# Patient Record
Sex: Male | Born: 1995 | Race: Black or African American | Hispanic: No | Marital: Single | Smoking: Former smoker
Health system: Southern US, Community
[De-identification: ages and names within clinical notes are randomized; demographics above are authoritative.]

## PROBLEM LIST (undated history)

## (undated) DIAGNOSIS — I10 Essential (primary) hypertension: Secondary | ICD-10-CM

## (undated) DIAGNOSIS — R011 Cardiac murmur, unspecified: Secondary | ICD-10-CM

## (undated) DIAGNOSIS — J189 Pneumonia, unspecified organism: Secondary | ICD-10-CM

## (undated) DIAGNOSIS — T7840XA Allergy, unspecified, initial encounter: Secondary | ICD-10-CM

## (undated) DIAGNOSIS — B009 Herpesviral infection, unspecified: Secondary | ICD-10-CM

## (undated) HISTORY — DX: Pneumonia, unspecified organism: J18.9

## (undated) HISTORY — PX: HAND SURGERY: SHX662

## (undated) HISTORY — DX: Cardiac murmur, unspecified: R01.1

## (undated) HISTORY — DX: Allergy, unspecified, initial encounter: T78.40XA

## (undated) HISTORY — DX: Essential (primary) hypertension: I10

## (undated) HISTORY — PX: OTHER SURGICAL HISTORY: SHX169

---
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
## 2015-09-26 ENCOUNTER — Encounter: Payer: Self-pay | Admitting: Family Medicine

## 2015-09-26 ENCOUNTER — Ambulatory Visit (INDEPENDENT_AMBULATORY_CARE_PROVIDER_SITE_OTHER): Payer: BLUE CROSS/BLUE SHIELD | Admitting: Family Medicine

## 2015-09-26 VITALS — BP 132/74 | Temp 98.5°F | Ht 67.5 in | Wt 215.1 lb

## 2015-09-26 DIAGNOSIS — J329 Chronic sinusitis, unspecified: Secondary | ICD-10-CM | POA: Diagnosis not present

## 2015-09-26 DIAGNOSIS — J301 Allergic rhinitis due to pollen: Secondary | ICD-10-CM

## 2015-09-26 DIAGNOSIS — J029 Acute pharyngitis, unspecified: Secondary | ICD-10-CM

## 2015-09-26 LAB — POCT RAPID STREP A (OFFICE): RAPID STREP A SCREEN: NEGATIVE

## 2015-09-26 MED ORDER — AMOXICILLIN-POT CLAVULANATE 875-125 MG PO TABS: ORAL_TABLET | ORAL | Status: DC

## 2015-09-26 MED ORDER — CETIRIZINE HCL 10 MG PO TABS: ORAL_TABLET | ORAL | Status: DC

## 2015-09-26 MED ORDER — FLUTICASONE PROPIONATE 50 MCG/ACT NA SUSP: 2.0000 | Freq: Every day | NASAL | Status: DC

## 2015-09-26 NOTE — Progress Notes (Signed)
   Subjective:    Patient ID: Karl Smith, male    DOB: December 09, 1995, 20 y.o.   MRN: [id number]  Sore Throat  This is a new problem. The current episode started 1 to 4 weeks ago. The pain is worse on the left side. There has been no fever. Associated symptoms include congestion.   No headaches, sclearing throat a lot, dimished enrgy  No fever  Sore throat worse in the morning frontal headache positive nasal congestion  2 months duration of symptoms worsens allergies season is connecting, history of allergies as a young child  Patient states no other concerns this visit.  Results for orders placed or performed in visit on 09/26/15  POCT rapid strep A  Result Value Ref Range   Rapid Strep A Screen Negative Negative      Review of Systems  HENT: Positive for congestion.    no vomiting no diarrhea no fever     Objective:   Physical Exam  Alert, mild malaise. Hydration good Vitals stable. frontal/ maxillary tenderness evident positive nasal congestion. pharynx normal neck supple  lungs clear/no crackles or wheezes. heart regular in rhythm       Assessment & Plan:  Impression rhinosinusitis likely post viral, discussed with patient. plan antibiotics prescribed. Questions answered. Symptomatic care discussed. warning signs discussed. WSL With allergic rhinitis component discussed will add antihistamine and steroid nasal spray also

## 2015-10-09 ENCOUNTER — Encounter: Payer: Self-pay | Admitting: Family Medicine

## 2015-10-09 ENCOUNTER — Ambulatory Visit (INDEPENDENT_AMBULATORY_CARE_PROVIDER_SITE_OTHER): Payer: BLUE CROSS/BLUE SHIELD | Admitting: Family Medicine

## 2015-10-09 VITALS — BP 138/74 | Temp 98.8°F | Ht 67.5 in | Wt 223.0 lb

## 2015-10-09 DIAGNOSIS — J020 Streptococcal pharyngitis: Secondary | ICD-10-CM

## 2015-10-09 DIAGNOSIS — J029 Acute pharyngitis, unspecified: Secondary | ICD-10-CM

## 2015-10-09 LAB — POCT RAPID STREP A (OFFICE): RAPID STREP A SCREEN: POSITIVE — AB

## 2015-10-09 MED ORDER — CLARITHROMYCIN 500 MG PO TABS: 500.0000 mg | ORAL_TABLET | Freq: Two times a day (BID) | ORAL | Status: DC

## 2015-10-09 NOTE — Progress Notes (Signed)
   Subjective:    Patient ID: Karl Smith, male    DOB: 1995-10-03, 20 y.o.   MRN: [id number]  Sore Throat  This is a new problem. Episode onset: 2 -3 weeks. Associated symptoms include coughing. Associated symptoms comments: Chest tightness, [place] spots on tonsils . Treatments tried: augmentin.   Diffuse headache at times. Diminished energy. Diminished appetite.   Venturino swollen patches in the throat.  Results for orders placed or performed in visit on 10/09/15  POCT rapid strep A  Result Value Ref Range   Rapid Strep A Screen Positive (A) Negative     Review of Systems  Respiratory: Positive for cough.    no vomiting or diarrhea     Objective:   Physical Exam Alert vitals stable HEENT impressive bilateral exudative tonsillitis tender anterior nodes neck supple lungs clear heart regular in rhythm       Assessment & Plan:  Impression tonsillitis with cervical lymphadenitis and positive strep screen plan antibiotics prescribed. Symptom care discussed warning signs

## 2015-11-13 ENCOUNTER — Ambulatory Visit (INDEPENDENT_AMBULATORY_CARE_PROVIDER_SITE_OTHER): Payer: BLUE CROSS/BLUE SHIELD | Admitting: Nurse Practitioner

## 2015-11-13 ENCOUNTER — Encounter: Payer: Self-pay | Admitting: Nurse Practitioner

## 2015-11-13 VITALS — BP 134/78 | Temp 98.0°F | Ht 67.5 in | Wt 227.5 lb

## 2015-11-13 DIAGNOSIS — J039 Acute tonsillitis, unspecified: Secondary | ICD-10-CM | POA: Diagnosis not present

## 2015-11-13 DIAGNOSIS — J300 Vasomotor rhinitis: Secondary | ICD-10-CM | POA: Diagnosis not present

## 2015-11-13 DIAGNOSIS — J029 Acute pharyngitis, unspecified: Secondary | ICD-10-CM | POA: Diagnosis not present

## 2015-11-13 LAB — POCT RAPID STREP A (OFFICE): RAPID STREP A SCREEN: NEGATIVE

## 2015-11-13 NOTE — Patient Instructions (Signed)
OTC antihistamine Steroid nasal spray (Flonase or Nasacort AQ)

## 2015-11-14 LAB — PLEASE NOTE

## 2015-11-14 LAB — STREP A DNA PROBE: STREP GP A DIRECT, DNA PROBE: NEGATIVE

## 2015-11-15 ENCOUNTER — Encounter: Payer: Self-pay | Admitting: Family Medicine

## 2015-11-15 ENCOUNTER — Encounter: Payer: Self-pay | Admitting: Nurse Practitioner

## 2015-11-15 NOTE — Progress Notes (Signed)
Subjective:  Presents for c/o swollen tonsils with [place] spots that began about a week ago. This is patient's third visit for sore throat since 4/25. No fever. Slight runny nose and cough. No wheezing. No ear pain. Taking fluids well. Voiding nl. Relates recurrent problems to exposure to dust at his job.   Objective:   BP 134/78 mmHg  Temp(Src) 98 F (36.7 C) (Oral)  Ht 5' 7.5" (1.715 m)  Wt 227 lb 8 oz (103.193 kg)  BMI 35.09 kg/m2 NAD. Alert, oriented. TMs clear effusion, no erythema. Pharynx non erythematous with a few areas of Clason exudate. RST neg. Neck supple with minimal anterior cervical adenopathy. Lungs clear. Heart RRR.   Assessment: Acute pharyngitis, unspecified etiology - Plan: POCT rapid strep A, Strep A DNA probe, Ambulatory referral to ENT  Vasomotor rhinitis - Plan: Ambulatory referral to ENT  Acute tonsillitis, unspecified etiology - Plan: Ambulatory referral to ENT   Plan: throat culture pending.  OTC antihistamine Steroid nasal spray (Flonase or Nasacort AQ) Refer to ENT specialist per patient request. Call back sooner if symptoms worsen.

## 2015-12-13 ENCOUNTER — Encounter: Payer: Self-pay | Admitting: Family Medicine

## 2015-12-13 ENCOUNTER — Ambulatory Visit (INDEPENDENT_AMBULATORY_CARE_PROVIDER_SITE_OTHER): Payer: BLUE CROSS/BLUE SHIELD | Admitting: Family Medicine

## 2015-12-13 VITALS — BP 126/80 | Ht 67.5 in | Wt 233.1 lb

## 2015-12-13 DIAGNOSIS — J301 Allergic rhinitis due to pollen: Secondary | ICD-10-CM
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
## 2016-01-10 ENCOUNTER — Encounter: Payer: Self-pay | Admitting: Family Medicine

## 2016-05-12 ENCOUNTER — Emergency Department (HOSPITAL_COMMUNITY): Payer: BLUE CROSS/BLUE SHIELD

## 2016-05-12 ENCOUNTER — Emergency Department (HOSPITAL_COMMUNITY)
Admission: EM | Admit: 2016-05-12 | Discharge: 2016-05-12 | Disposition: A | Discharge status: Home / Self Care | Payer: BLUE CROSS/BLUE SHIELD | Attending: Emergency Medicine | Admitting: Emergency Medicine

## 2016-05-12 ENCOUNTER — Encounter (HOSPITAL_COMMUNITY): Payer: Self-pay | Admitting: Emergency Medicine

## 2016-05-12 DIAGNOSIS — Z87891 Personal history of nicotine dependence: Secondary | ICD-10-CM | POA: Diagnosis not present

## 2016-05-12 DIAGNOSIS — M79641 Pain in right hand: Secondary | ICD-10-CM | POA: Diagnosis not present

## 2016-05-12 DIAGNOSIS — Z23 Encounter for immunization: Secondary | ICD-10-CM | POA: Diagnosis not present

## 2016-05-12 DIAGNOSIS — S60511A Abrasion of right hand, initial encounter: Secondary | ICD-10-CM

## 2016-05-12 DIAGNOSIS — S60311A Abrasion of right thumb, initial encounter: Secondary | ICD-10-CM | POA: Diagnosis not present

## 2016-05-12 DIAGNOSIS — Y999 Unspecified external cause status: Secondary | ICD-10-CM | POA: Insufficient documentation

## 2016-05-12 DIAGNOSIS — Y939 Activity, unspecified: Secondary | ICD-10-CM | POA: Insufficient documentation

## 2016-05-12 DIAGNOSIS — Y929 Unspecified place or not applicable: Secondary | ICD-10-CM | POA: Insufficient documentation

## 2016-05-12 DIAGNOSIS — W2201XA Walked into wall, initial encounter: Secondary | ICD-10-CM | POA: Diagnosis not present

## 2016-05-12 DIAGNOSIS — S6991XA Unspecified injury of right wrist, hand and finger(s), initial encounter: Secondary | ICD-10-CM | POA: Diagnosis present

## 2016-05-12 MED ORDER — BACITRACIN ZINC 500 UNIT/GM EX OINT
TOPICAL_OINTMENT | CUTANEOUS | Status: AC
  Administered 2016-05-12: 1
  Filled 2016-05-12: qty 0.9

## 2016-05-12 MED ORDER — IBUPROFEN 800 MG PO TABS
800.0000 mg | ORAL_TABLET | Freq: Once | ORAL | Status: AC
  Administered 2016-05-12: 800 mg via ORAL
  Filled 2016-05-12: qty 1

## 2016-05-12 MED ORDER — TETANUS-DIPHTH-ACELL PERTUSSIS 5-2.5-18.5 LF-MCG/0.5 IM SUSP
0.5000 mL | Freq: Once | INTRAMUSCULAR | Status: AC
  Administered 2016-05-12: 0.5 mL via INTRAMUSCULAR
  Filled 2016-05-12: qty 0.5

## 2016-05-12 MED ORDER — IBUPROFEN 800 MG PO TABS: 800.0000 mg | ORAL_TABLET | Freq: Three times a day (TID) | ORAL | 0 refills | Status: DC

## 2016-05-12 NOTE — ED Provider Notes (Signed)
[facility]-EMERGENCY DEPT Provider Note   CSN: [id number] Arrival date & time: 05/12/16  1758  By signing my name below, I, Teofilo PodMatthew P. Jamison, attest that this documentation has been prepared under the direction and in the presence of Langston MaskerKaren Sofia, [place]PA-C. Electronically Signed: Teofilo PodMatthew P. Jamison, ED Scribe. 05/12/2016. 7:39 PM.    History   Chief Complaint Chief Complaint  Patient presents with  . Hand Injury    The history is provided by the patient. No language interpreter was used.   HPI Comments:  Karl Smith is a 20 y.o. male who presents to the Emergency Department s/p an injury to his right hand that he sustained PTA. Pt reports that he punched a wall made of wood. Pt notes a wound and swelling to his right thumb. Tetanus is not UTD. Pt has been able to control bleeding with a rag. Pt denies other associated symptoms.   Past Medical History:  Diagnosis Date  . Allergy    seasonal  . Heart murmur   . High blood pressure   . Pneumonia 3918 months old    Patient Active Problem List   Diagnosis Date Noted  . Elevated blood pressure 11/23/2012  . CLOSED FRACTURE OF BASE OF THUMB METACARPAL BONE 02/01/2008    Past Surgical History:  Procedure Laterality Date  . HAND SURGERY    . thumb surgery    . thumb surgery Right 7th grade       Home Medications    Prior to Admission medications   Medication Sig Start Date End Date Taking? Authorizing Provider  cetirizine (ZYRTEC) 10 MG tablet Take 1 tablet by mouth at bedtime Patient not taking: Reported on 11/13/2015 09/26/15   Merlyn AlbertWilliam S Luking, MD  clarithromycin (BIAXIN) 500 MG tablet Take 1 tablet (500 mg total) by mouth 2 (two) times daily. Patient not taking: Reported on 11/13/2015 10/09/15   Merlyn AlbertWilliam S Luking, MD  fexofenadine (ALLEGRA) 180 MG tablet Take 180 mg by mouth daily.    Historical Provider, MD  fluticasone (FLONASE) 50 MCG/ACT nasal spray Place 2 sprays into both nostrils daily. Patient not taking: Reported on  11/13/2015 09/26/15   Merlyn AlbertWilliam S Luking, MD    Family History Family History  Problem Relation Age of Onset  . Depression Father   . Bipolar disorder Cousin     Social History Social History  Substance Use Topics  . Smoking status: Former Games developermoker  . Smokeless tobacco: Never Used  . Alcohol use No     Allergies   Patient has no known allergies.   Review of Systems Review of Systems  Musculoskeletal: Positive for arthralgias and joint swelling.  Skin: Positive for wound.  All other systems reviewed and are negative.    Physical Exam Updated Vital Signs BP 139/84 (BP Location: Left Arm)   Pulse 85   Temp 98.4 F (36.9 C) (Oral)   Resp 18   Ht 5\' 7"  (1.702 m)   Wt 230 lb (104.3 kg)   SpO2 100%   BMI 36.02 kg/m   Physical Exam  Constitutional: He appears well-developed and well-nourished. No distress.  HENT:  Head: Normocephalic and atraumatic.  Eyes: Conjunctivae are normal.  Cardiovascular: Normal rate.   Pulmonary/Chest: Effort normal.  Abdominal: He exhibits no distension.  Neurological: He is alert.  Skin: Skin is warm and dry.  Superficial abrasions to right thumb. Moderate swelling. Neurosensory and neurovascularly intact.  Psychiatric: He has a normal mood and affect.  Nursing note and vitals reviewed.  ED Treatments / Results  DIAGNOSTIC STUDIES:  Oxygen Saturation is 100% on RA, normal by my interpretation.    COORDINATION OF CARE:  7:39 PM Discussed treatment plan with pt at bedside and pt agreed to plan.   Labs (all labs ordered are listed, but only abnormal results are displayed) Labs Reviewed - No data to display  EKG  EKG Interpretation None       Radiology Dg Hand Complete Right  Result Date: 05/12/2016 CLINICAL DATA:  Punched wall with hand pain, initial encounter EXAM: RIGHT HAND - COMPLETE 3+ VIEW COMPARISON:  None. FINDINGS: There is no evidence of fracture or dislocation. There is no evidence of arthropathy or other  focal bone abnormality. Soft tissues are unremarkable. IMPRESSION: No acute abnormality noted. Electronically Signed   By: Alcide CleverMark  Lukens M.D.   On: 05/12/2016 19:14    Procedures Procedures (including critical care time)  Medications Ordered in ED Medications - No data to display   Initial Impression / Assessment and Plan / ED Course  I have reviewed the triage vital signs and the nursing notes.  Pertinent labs & imaging results that were available during my care of the patient were reviewed by me and considered in my medical decision making (see chart for details).  Clinical Course     No fracture superficail abrasions,  Pt given tetanus and ibuprofen  Final Clinical Impressions(s) / ED Diagnoses   Final diagnoses:  Abrasion of right hand, initial encounter    New Prescriptions Discharge Medication List as of 05/12/2016  7:49 PM    START taking these medications   Details  ibuprofen (ADVIL,MOTRIN) 800 MG tablet Take 1 tablet (800 mg total) by mouth 3 (three) times daily., Starting Sun 05/12/2016, Print      An After Visit Summary was printed and given to the patient.   Karl SkinnerLeslie K CreeksideSofia, PA-C 05/12/16 2116    Samuel JesterKathleen McManus, DO 05/16/16 2101

## 2016-05-12 NOTE — ED Triage Notes (Signed)
PT states he punched a wooded wall this evening and now c/o pain with bleeding controlled around right thumb nail bed with swelling.

## 2016-05-12 NOTE — ED Notes (Signed)
Thumb washed with soap and water, anbx ointment applied, nonstick telfa, gauze, tape applied.

## 2016-06-11 ENCOUNTER — Ambulatory Visit (INDEPENDENT_AMBULATORY_CARE_PROVIDER_SITE_OTHER): Payer: BLUE CROSS/BLUE SHIELD | Admitting: Family Medicine

## 2016-06-11 ENCOUNTER — Encounter: Payer: Self-pay | Admitting: Family Medicine

## 2016-06-11 VITALS — Temp 98.2°F | Ht 67.5 in | Wt 245.2 lb

## 2016-06-11 DIAGNOSIS — J329 Chronic sinusitis, unspecified: Secondary | ICD-10-CM | POA: Diagnosis not present

## 2016-06-11 DIAGNOSIS — J4520 Mild intermittent asthma, uncomplicated: Secondary | ICD-10-CM | POA: Diagnosis not present

## 2016-06-11 DIAGNOSIS — J310 Chronic rhinitis: Secondary | ICD-10-CM

## 2016-06-11 MED ORDER — NAPROXEN 500 MG PO TABS: 500.0000 mg | ORAL_TABLET | Freq: Two times a day (BID) | ORAL | 0 refills | Status: DC | PRN

## 2016-06-11 MED ORDER — ALBUTEROL SULFATE HFA 108 (90 BASE) MCG/ACT IN AERS: 2.0000 | INHALATION_SPRAY | Freq: Four times a day (QID) | RESPIRATORY_TRACT | 0 refills | Status: DC | PRN

## 2016-06-11 MED ORDER — AZITHROMYCIN 250 MG PO TABS: ORAL_TABLET | ORAL | 0 refills | Status: DC

## 2016-06-11 NOTE — Progress Notes (Signed)
   Subjective:    Patient ID: Karl Smith, male    DOB: [birth year], 21 y.o.   MRN: [id number]  Cough  This is a new problem. The current episode started more than 1 month ago (off and on-restarted this week). Associated symptoms include ear pain, headaches, nasal congestion, a sore throat and wheezing. Treatments tried: nyquil.    Feel sob with the cough and wheezing  Frontal headache mucinex severe   Coughing up dark yellow stufpt smokes soe   Wheezing can occur with exrtion   Review of Systems  HENT: Positive for ear pain and sore throat.   Respiratory: Positive for cough and wheezing.   Neurological: Positive for headaches.       Objective:   Physical Exam Alert mild malaise occasional bronchial cough during exam. H&T mom his congestion pharynx normal lungs rare wheeze heart rare rhythm       Assessment & Plan:  Impression post viral rhinosinusitis/bronchitis with element of reactive airways plan Z-Pak. Albuterol 2 sprays 4 times a day symptom care discussed warning signs discussed WSL

## 2016-09-12 DIAGNOSIS — S93402A Sprain of unspecified ligament of left ankle, initial encounter: Secondary | ICD-10-CM | POA: Diagnosis not present

## 2016-09-16 DIAGNOSIS — F329 Major depressive disorder, single episode, unspecified: Secondary | ICD-10-CM | POA: Diagnosis not present

## 2016-09-30 DIAGNOSIS — F329 Major depressive disorder, single episode, unspecified: Secondary | ICD-10-CM | POA: Diagnosis not present

## 2016-10-07 DIAGNOSIS — F329 Major depressive disorder, single episode, unspecified: Secondary | ICD-10-CM | POA: Diagnosis not present

## 2016-11-04 DIAGNOSIS — F329 Major depressive disorder, single episode, unspecified: Secondary | ICD-10-CM | POA: Diagnosis not present

## 2017-01-28 ENCOUNTER — Encounter: Payer: Self-pay | Admitting: Family Medicine

## 2017-01-28 ENCOUNTER — Ambulatory Visit (INDEPENDENT_AMBULATORY_CARE_PROVIDER_SITE_OTHER): Payer: BLUE CROSS/BLUE SHIELD | Admitting: Family Medicine

## 2017-01-28 VITALS — BP 118/76 | Ht 67.5 in | Wt 219.2 lb

## 2017-01-28 DIAGNOSIS — J310 Chronic rhinitis: Secondary | ICD-10-CM

## 2017-01-28 DIAGNOSIS — J329 Chronic sinusitis, unspecified: Secondary | ICD-10-CM

## 2017-01-28 MED ORDER — AMOXICILLIN 500 MG PO CAPS: 500.0000 mg | ORAL_CAPSULE | Freq: Three times a day (TID) | ORAL | 0 refills | Status: DC
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
## 2017-05-29 ENCOUNTER — Encounter: Payer: Self-pay | Admitting: Family Medicine

## 2017-10-25 IMAGING — DX DG HAND COMPLETE 3+V*R*
3 series · 3 of 3 positions shown · non-contrast
Comparison: None.

CLINICAL DATA: Punched wall with hand pain, initial encounter

EXAM:
RIGHT HAND - COMPLETE 3+ VIEW

[hand pa]
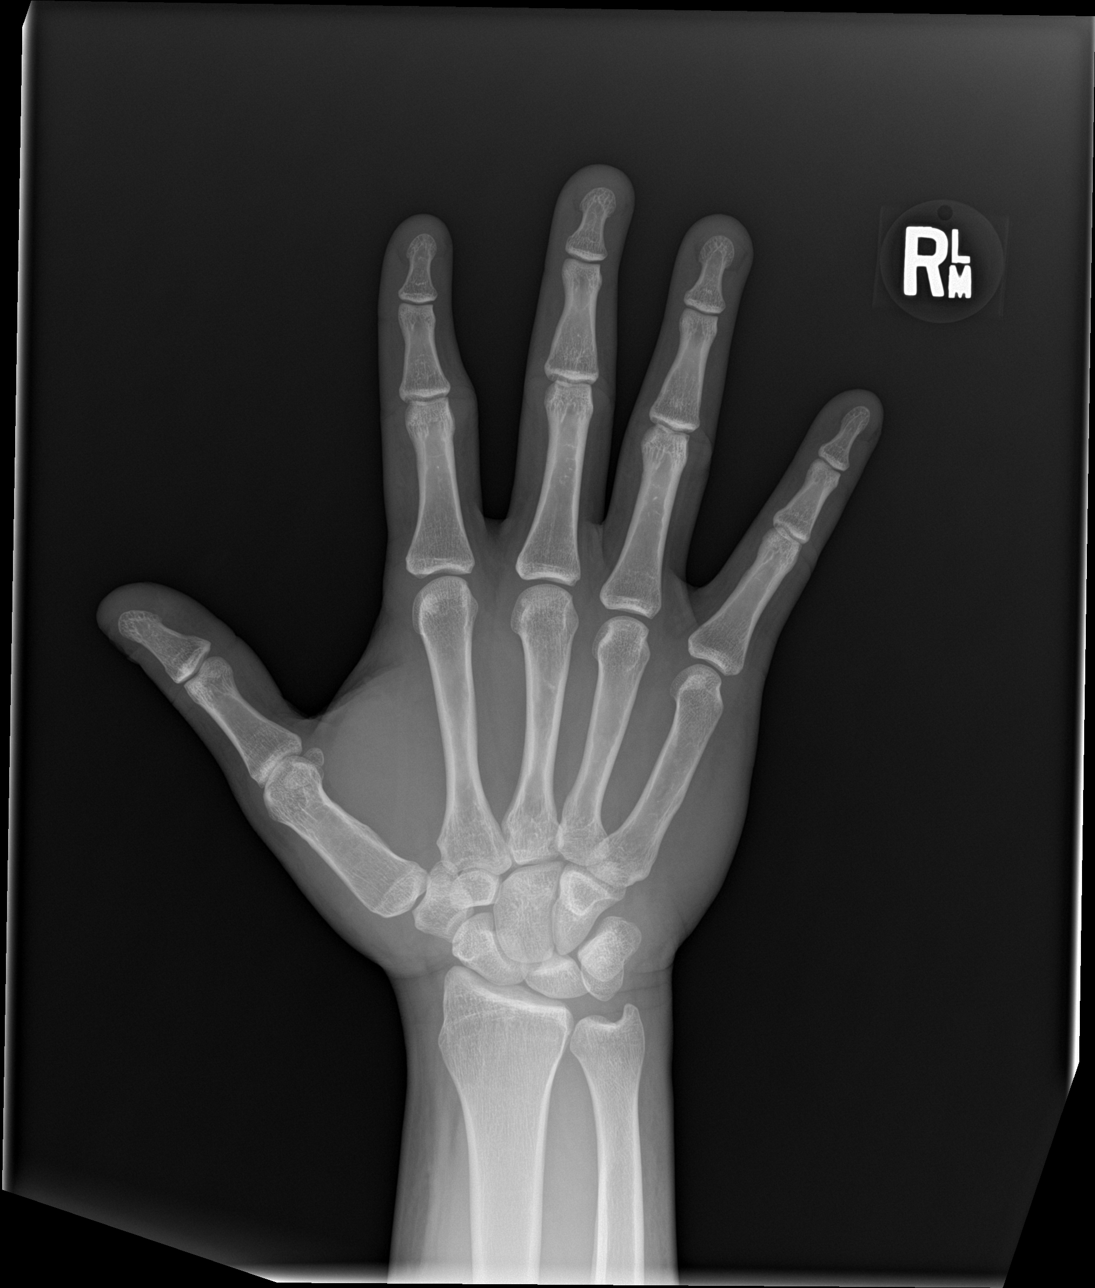

[hand obl]
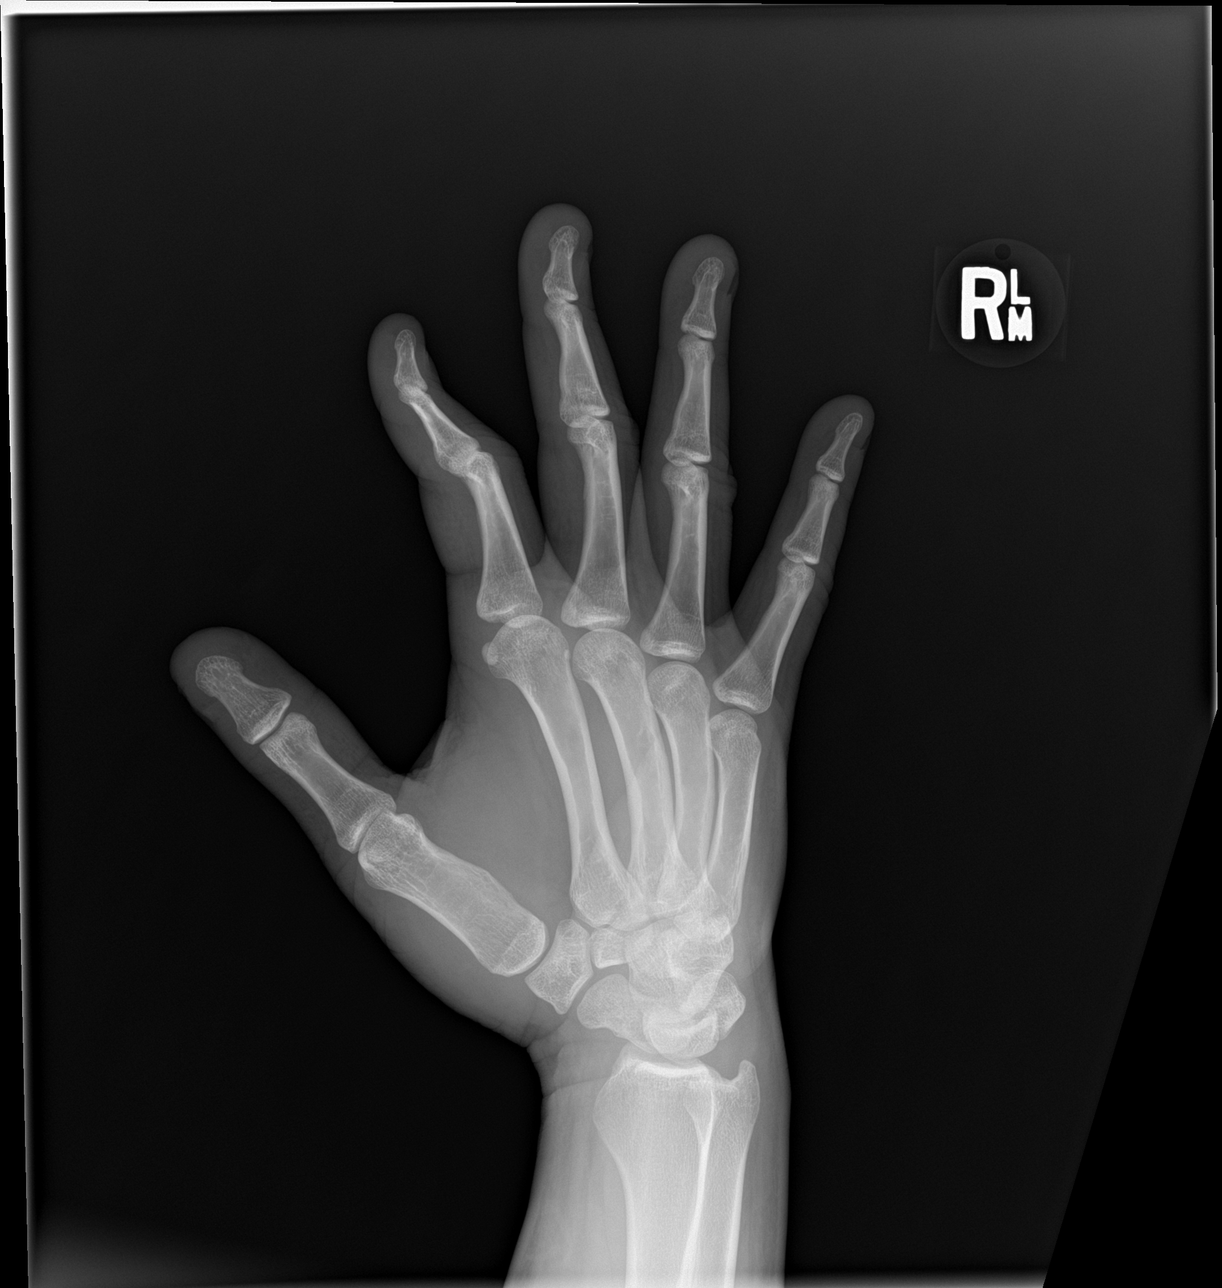

[hand lat]
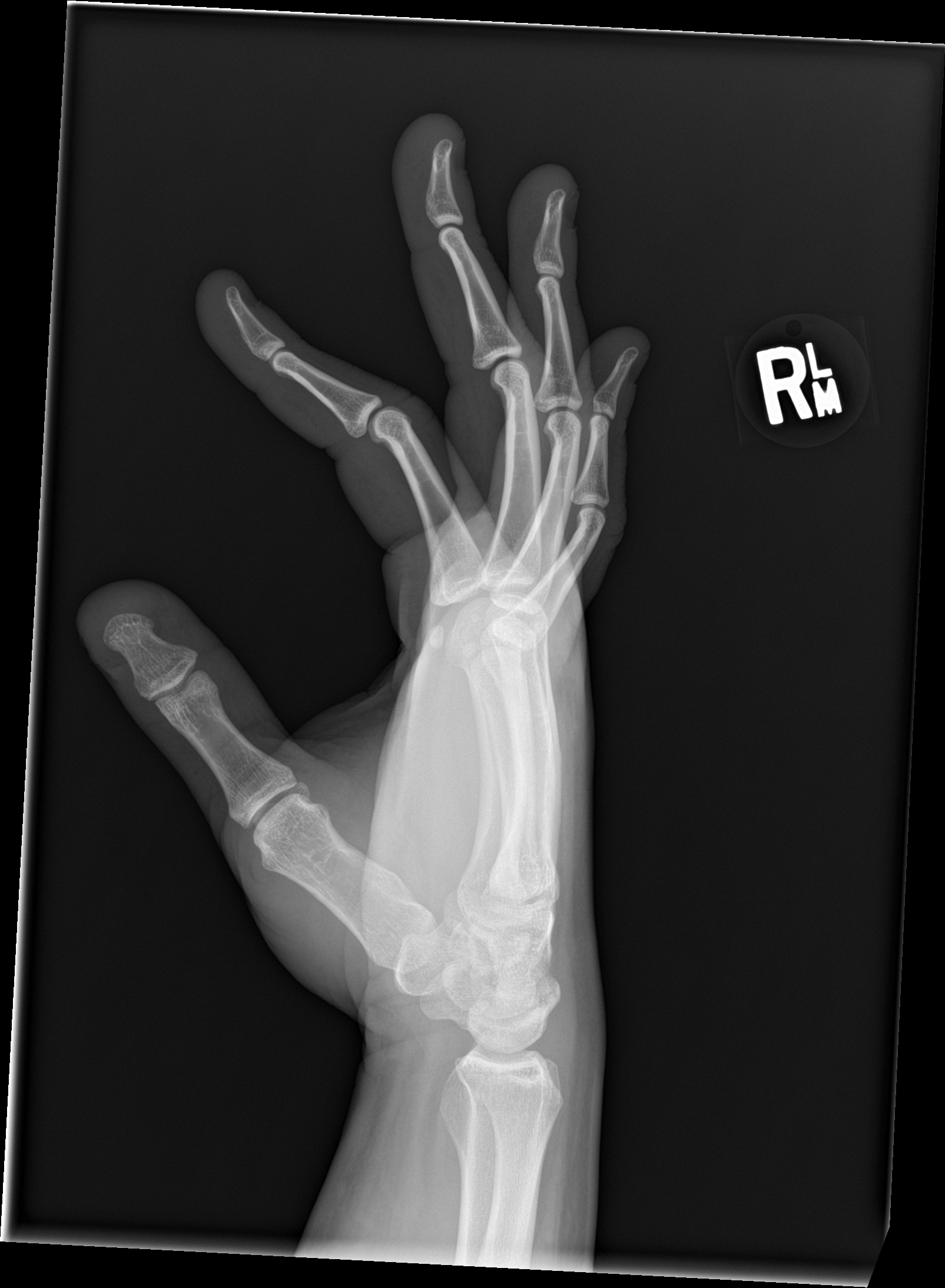

[3 of 3 positions shown; findings below may reference images not displayed]

FINDINGS: There is no evidence of fracture or dislocation. There is no
evidence of arthropathy or other focal bone abnormality. Soft
tissues are unremarkable.
IMPRESSION: No acute abnormality noted.

## 2018-02-16 ENCOUNTER — Ambulatory Visit: Payer: BLUE CROSS/BLUE SHIELD | Admitting: Family Medicine

## 2018-02-16 ENCOUNTER — Encounter: Payer: Self-pay | Admitting: Family Medicine

## 2018-02-16 VITALS — BP 118/80 | Temp 98.8°F | Ht 67.5 in | Wt 211.4 lb

## 2018-02-16 DIAGNOSIS — R300 Dysuria: Secondary | ICD-10-CM | POA: Diagnosis not present

## 2018-02-16 LAB — POCT URINALYSIS DIPSTICK
PH UA: 5 (ref 5.0–8.0)
PROTEIN UA: POSITIVE — AB
Spec Grav, UA: >=1.03 — AB (ref 1.010–1.025)

## 2018-02-16 MED ORDER — DOXYCYCLINE HYCLATE 100 MG PO TABS: 100.0000 mg | ORAL_TABLET | Freq: Two times a day (BID) | ORAL | 0 refills | Status: DC

## 2018-02-16 NOTE — Progress Notes (Signed)
   Subjective:    Patient ID: Karl Smith, male    DOB: 1996-01-18, 22 y.o.   MRN: [id number]  Urinary Tract Infection   This is a new problem. The current episode started in the past 7 days. The quality of the pain is described as aching. Associated symptoms comments: Pressure near bladder. He has tried nothing for the symptoms.   Pt states his girlfriend was tested today and was told she may have chlyamidia.    Pt is not having any signs/symptoms of STDS.  Results for orders placed or performed in visit on 02/16/18  POCT Urinalysis Dipstick  Result Value Ref Range   Color, UA     Clarity, UA     Glucose, UA     Bilirubin, UA +    Ketones, UA     Spec Grav, UA >=1.030 (A) 1.010 - 1.025   Blood, UA     pH, UA 5.0 5.0 - 8.0   Protein, UA Positive (A) Negative   Urobilinogen, UA     Nitrite, UA     Leukocytes, UA     Appearance     Odor     Positive potential expoure to chlamydia  No dysuria  No discharge   No night   Review of Systems No headache, no major weight loss or weight gain, no chest pain no back pain abdominal pain no change in bowel habits complete ROS otherwise negative     Objective:   Physical Exam  Alert vitals stable, NAD. Blood pressure good on repeat. HEENT normal. Lungs clear. Heart regular rate and rhythm.       Assessment & Plan:  Impression positive exposure to chlamydia through his girlfriend.  Discussed.  Urinalysis unremarkable.  With definite positive exposure confirmed, recommend treatment.  Management discussed

## 2018-02-28 ENCOUNTER — Encounter (HOSPITAL_COMMUNITY): Payer: Self-pay | Admitting: Emergency Medicine

## 2018-02-28 ENCOUNTER — Other Ambulatory Visit: Payer: Self-pay

## 2018-02-28 ENCOUNTER — Emergency Department (HOSPITAL_COMMUNITY)
Admission: EM | Admit: 2018-02-28 | Discharge: 2018-02-28 | Disposition: A | Discharge status: Home / Self Care | Payer: BLUE CROSS/BLUE SHIELD | Attending: Emergency Medicine | Admitting: Emergency Medicine

## 2018-02-28 DIAGNOSIS — Z79899 Other long term (current) drug therapy: Secondary | ICD-10-CM | POA: Diagnosis not present

## 2018-02-28 DIAGNOSIS — R300 Dysuria: Secondary | ICD-10-CM | POA: Diagnosis not present

## 2018-02-28 DIAGNOSIS — I10 Essential (primary) hypertension: Secondary | ICD-10-CM | POA: Diagnosis not present

## 2018-02-28 DIAGNOSIS — Z87891 Personal history of nicotine dependence: Secondary | ICD-10-CM | POA: Diagnosis not present

## 2018-02-28 LAB — URINALYSIS, ROUTINE W REFLEX MICROSCOPIC
BILIRUBIN URINE: NEGATIVE
Glucose, UA: NEGATIVE mg/dL
Hgb urine dipstick: NEGATIVE
KETONES UR: NEGATIVE mg/dL
Leukocytes, UA: NEGATIVE
NITRITE: NEGATIVE
Protein, ur: NEGATIVE mg/dL
Specific Gravity, Urine: 1.027 (ref 1.005–1.030)
pH: 5 (ref 5.0–8.0)

## 2018-02-28 MED ORDER — AZITHROMYCIN 250 MG PO TABS
1000.0000 mg | ORAL_TABLET | Freq: Once | ORAL | Status: AC
  Administered 2018-02-28: 1000 mg via ORAL
  Filled 2018-02-28: qty 4

## 2018-02-28 MED ORDER — CEFTRIAXONE SODIUM 250 MG IJ SOLR
250.0000 mg | Freq: Once | INTRAMUSCULAR | Status: AC
  Administered 2018-02-28: 250 mg via INTRAMUSCULAR
  Filled 2018-02-28: qty 250

## 2018-02-28 MED ORDER — STERILE WATER FOR INJECTION IJ SOLN
INTRAMUSCULAR | Status: AC
  Administered 2018-02-28: 10 mL
  Filled 2018-02-28: qty 10

## 2018-02-28 NOTE — ED Triage Notes (Signed)
Patient seen by PCP a week ago and diagnosed with UTI after having lower abd pressure and urinary frequency. Patient states symptoms did improve but now has dysuria and some soreness in right flank pain. Patient also reports pain with intercourse. Denies any discharge. Denies any fevers.

## 2018-02-28 NOTE — ED Provider Notes (Signed)
[facility] EMERGENCY DEPARTMENT Provider Note   CSN: [id number] Arrival date & time: 02/28/18  1308     History   Chief Complaint Chief Complaint  Patient presents with  . Dysuria    HPI Karl Smith is a 22 y.o. male is here for evaluation of dysuria.  Onset 2 days ago.  Associated with burning discomfort to the tip of the penis with ejaculation.  Has had similar symptoms twice in the past, one time while he was incarcerated and was not sexually active.  This episode resolved on its own after he drank a lot of water.  Second episode occurred 2 months ago and he went to his PCP who told him he had a UTI and gave him doxycycline which she took completely.  At that time, his girlfriend was also having some UTI type symptoms.  He denies any previous diagnosed STDs in the past.  He denies fevers, testicular pain, blood in the urine, abdominal pain, flank pain, genital lesions, inflammation of penis.  He is sexually active with male partner without condom use.  No interventions for this.  No alleviating factors.  HPI  Past Medical History:  Diagnosis Date  . Allergy    seasonal  . Heart murmur   . High blood pressure   . Pneumonia 53 months old    Patient Active Problem List   Diagnosis Date Noted  . Elevated blood pressure 11/23/2012  . CLOSED FRACTURE OF BASE OF THUMB METACARPAL BONE 02/01/2008    Past Surgical History:  Procedure Laterality Date  . HAND SURGERY    . thumb surgery    . thumb surgery Right 7th grade        Home Medications    Prior to Admission medications   Medication Sig Start Date End Date Taking? Authorizing Provider  albuterol (PROVENTIL HFA;VENTOLIN HFA) 108 (90 Base) MCG/ACT inhaler Inhale 2 puffs into the lungs every 6 (six) hours as needed for wheezing or shortness of breath. 06/11/16   Merlyn Albert, MD  amoxicillin (AMOXIL) 500 MG capsule Take 1 capsule (500 mg total) by mouth 3 (three) times daily. 01/28/17   Merlyn Albert, MD    azithromycin (ZITHROMAX Z-PAK) 250 MG tablet Take 2 tablets (500 mg) on  Day 1,  followed by 1 tablet (250 mg) once daily on Days 2 through 5. 06/11/16   Merlyn Albert, MD  doxycycline (VIBRA-TABS) 100 MG tablet Take 1 tablet (100 mg total) by mouth 2 (two) times daily. 02/16/18   Merlyn Albert, MD  fexofenadine (ALLEGRA) 180 MG tablet Take 180 mg by mouth daily.    [provider]  ibuprofen (ADVIL,MOTRIN) 800 MG tablet Take 1 tablet (800 mg total) by mouth 3 (three) times daily. 05/12/16   Elson Areas, PA-C  naproxen (NAPROSYN) 500 MG tablet Take 1 tablet (500 mg total) by mouth 2 (two) times daily as needed. 06/11/16   Merlyn Albert, MD    Family History Family History  Problem Relation Age of Onset  . Depression Father   . Bipolar disorder Cousin     Social History Social History   Tobacco Use  . Smoking status: Former Smoker    Types: Cigarettes  . Smokeless tobacco: Never Used  Substance Use Topics  . Alcohol use: Yes    Alcohol/week: 1.0 standard drinks    Types: 1 Cans of beer per week  . Drug use: No     Allergies   Patient has no  known allergies.   Review of Systems Review of Systems  Genitourinary: Positive for dysuria.  All other systems reviewed and are negative.    Physical Exam Updated Vital Signs BP 127/86 (BP Location: Right Arm)   Pulse 62   Temp 99.1 F (37.3 C) (Oral)   Resp 14   Ht 5' 7.5" (1.715 m)   Wt 95.3 kg   SpO2 100%   BMI 32.41 kg/m   Physical Exam  Constitutional: He is oriented to person, place, and time. He appears well-developed and well-nourished. No distress.  NAD.  HENT:  Head: Normocephalic and atraumatic.  Right Ear: External ear normal.  Left Ear: External ear normal.  Nose: Nose normal.  Eyes: Conjunctivae and EOM are normal. No scleral icterus.  Neck: Normal range of motion. Neck supple.  Cardiovascular: Normal rate, regular rhythm, normal heart sounds and intact distal pulses.  No murmur  heard. Pulmonary/Chest: Effort normal and breath sounds normal. He has no wheezes.  Abdominal: Soft. There is no tenderness.  No suprapubic or CVA tenderness.  Negative Murphy's and McBurney's.  No G/R/R.  Genitourinary:  Genitourinary Comments: External genitalia normal without erythema, edema, tenderness or lesions.  Circumcised male.  No groin lymphadenopathy.  Scant clear meatus discharge Glans and shaft smooth without tenderness, lesions, masses or deformity.  Scrotum without lesions or edema.  Non tender testicles. Epididymis and spermatic cord without tenderness or masses, bilaterally.  Musculoskeletal: Normal range of motion. He exhibits no deformity.  Neurological: He is alert and oriented to person, place, and time.  Skin: Skin is warm and dry. Capillary refill takes less than 2 seconds.  Psychiatric: He has a normal mood and affect. His behavior is normal. Judgment and thought content normal.  Nursing note and vitals reviewed.    ED Treatments / Results  Labs (all labs ordered are listed, but only abnormal results are displayed) Labs Reviewed  URINALYSIS, ROUTINE W REFLEX MICROSCOPIC  GC/CHLAMYDIA PROBE AMP ([place]) NOT AT [facility]    EKG None  Radiology No results found.  Procedures Procedures (including critical care time)  Medications Ordered in ED Medications  cefTRIAXone (ROCEPHIN) injection 250 mg (250 mg Intramuscular Given 02/28/18 1413)  azithromycin (ZITHROMAX) tablet 1,000 mg (1,000 mg Oral Given 02/28/18 1413)  sterile water (preservative free) injection (10 mLs  Given 02/28/18 1413)     Initial Impression / Assessment and Plan / ED Course  I have reviewed the triage vital signs and the nursing notes.  Pertinent labs & imaging results that were available during my care of the patient were reviewed by me and considered in my medical decision making (see chart for details).     -Differential is a STD given age, sexual practices.  I reviewed  patient's previous PCP visit for dysuria 2 weeks ago, urinalysis does not appear to be infected at that time.  UA today is negative.  It does not look significantly concentrated, normal pH.  There was scant amount of clear meatus discharge which suggest likely STD as well.  GC/chlamydia swabs pending.  He was empirically treated in the ER with ceftriaxone and azithromycin.  Provided safe sex practices education.  Patient was discharged with instructions to follow-up with PCP in 1 week if symptoms are persistent.  He has no fever, chills, suprapubic or CVA tenderness, penile lesions to suggest systemic infection, pyelonephritis, candidal irritation.  Final Clinical Impressions(s) / ED Diagnoses   Final diagnoses:  Dysuria    ED Discharge Orders    None  Liberty Handy, PA-C 02/28/18 1500    Loren Racer, MD 03/01/18 1233

## 2018-02-28 NOTE — Discharge Instructions (Signed)
You were seen in the ER for burning with urination.  You do not have a urinary tract infection.  We tested you for gonorrhea and chlamydia today, the results come back in 2 to 3 days.  You will be called with positive results only.  You were treated today for gonorrhea and chlamydia given your symptoms and sexual practices.  Do not have sexual encounters for the next 7 to 10 days after treatment.  Follow-up with your primary care doctor in the next week if the symptoms have not improved.  Return to the ER for fevers, chills, abdominal pain, blood in your urine, penile discharge, testicular pain or swelling, flank pain.

## 2018-03-02 LAB — GC/CHLAMYDIA PROBE AMP (~~LOC~~) NOT AT ARMC
CHLAMYDIA, DNA PROBE: NEGATIVE
NEISSERIA GONORRHEA: NEGATIVE

## 2018-03-03 ENCOUNTER — Encounter: Payer: Self-pay | Admitting: Family Medicine

## 2018-03-03 ENCOUNTER — Ambulatory Visit: Payer: BLUE CROSS/BLUE SHIELD | Admitting: Family Medicine

## 2018-03-03 VITALS — BP 138/72 | Temp 99.3°F | Ht 67.5 in | Wt 209.0 lb

## 2018-03-03 DIAGNOSIS — Z113 Encounter for screening for infections with a predominantly sexual mode of transmission: Secondary | ICD-10-CM | POA: Diagnosis not present

## 2018-03-03 DIAGNOSIS — R300 Dysuria: Secondary | ICD-10-CM

## 2018-03-03 DIAGNOSIS — K1379 Other lesions of oral mucosa: Secondary | ICD-10-CM

## 2018-03-03 LAB — POCT URINALYSIS DIPSTICK
LEUKOCYTES UA: NEGATIVE
PH UA: 5 (ref 5.0–8.0)
RBC UA: NEGATIVE
Spec Grav, UA: >=1.03 — AB (ref 1.010–1.025)

## 2018-03-03 NOTE — Progress Notes (Signed)
   Subjective:    Patient ID: Karl Smith, male    DOB: 1996/04/17, 22 y.o.   MRN: [id number]  HPI  Patient states he is here today due to having some dysuria for the past week. He states he was tested for uti and std here around two weeks ago and again on the is past Saturday tested again for std at [facility] ed.He states it burns to urinate and he also has some fever blisters and headache,sore throat,fever . He has been taking nyquil and  otc allergy pills abreva for cold sore and antibx for the burning and also a "shot".  Review of Systems     Results for orders placed or performed in visit on 03/03/18  POCT urinalysis dipstick  Result Value Ref Range   Color, UA     Clarity, UA     Glucose, UA     Bilirubin, UA     Ketones, UA     Spec Grav, UA >=1.030 (A) 1.010 - 1.025   Blood, UA Negative    pH, UA 5.0 5.0 - 8.0   Protein, UA     Urobilinogen, UA     Nitrite, UA     Leukocytes, UA Negative Negative   Appearance     Odor      Objective:   Physical Exam  Alert vitals stable, NAD. Blood pressure good on repeat. HEENT see below lungs clear. Heart regular rate and rhythm. Impressive exudative tonsillitis noted/ulcerations in mouth and on lips also noted  Genital region generally within normal limits      Assessment & Plan:  Impression impressive exudative tonsillitis with ulcerations and even rash around the lips.  Need to do a viral culture rationale discussed also will do Bactrim culture  STD concerns screening done per patient request.  Addendum all STD results came back negative the viral culture was positive for herpes simplex 1

## 2018-03-04 DIAGNOSIS — Z113 Encounter for screening for infections with a predominantly sexual mode of transmission: Secondary | ICD-10-CM | POA: Diagnosis not present

## 2018-03-04 DIAGNOSIS — K1379 Other lesions of oral mucosa: Secondary | ICD-10-CM | POA: Diagnosis not present

## 2018-03-04 LAB — STREP A DNA PROBE: Strep Gp A Direct, DNA Probe: NEGATIVE

## 2018-03-05 LAB — RPR: RPR: NONREACTIVE

## 2018-03-05 LAB — HIV ANTIBODY (ROUTINE TESTING W REFLEX): HIV Screen 4th Generation wRfx: NONREACTIVE

## 2018-03-06 ENCOUNTER — Other Ambulatory Visit: Payer: Self-pay | Admitting: *Deleted

## 2018-03-06 LAB — CHLAMYDIA/GONOCOCCUS/TRICHOMONAS, NAA
CHLAMYDIA BY NAA: NEGATIVE
GONOCOCCUS BY NAA: NEGATIVE
Trich vag by NAA: NEGATIVE

## 2018-03-06 LAB — SPECIMEN STATUS REPORT

## 2018-03-06 LAB — HERPES SIMPLEX VIRUS CULTURE

## 2018-03-06 MED ORDER — VALACYCLOVIR HCL 1 G PO TABS: ORAL_TABLET | ORAL | 0 refills | Status: DC

## 2018-05-24 ENCOUNTER — Emergency Department (HOSPITAL_COMMUNITY)
Admission: EM | Admit: 2018-05-24 | Discharge: 2018-05-24 | Disposition: A | Discharge status: Home / Self Care | Payer: BLUE CROSS/BLUE SHIELD | Attending: Emergency Medicine | Admitting: Emergency Medicine

## 2018-05-24 ENCOUNTER — Encounter (HOSPITAL_COMMUNITY): Payer: Self-pay | Admitting: Emergency Medicine

## 2018-05-24 ENCOUNTER — Other Ambulatory Visit: Payer: Self-pay

## 2018-05-24 DIAGNOSIS — R369 Urethral discharge, unspecified: Secondary | ICD-10-CM | POA: Diagnosis not present

## 2018-05-24 DIAGNOSIS — Z202 Contact with and (suspected) exposure to infections with a predominantly sexual mode of transmission: Secondary | ICD-10-CM | POA: Diagnosis not present

## 2018-05-24 DIAGNOSIS — Z79899 Other long term (current) drug therapy: Secondary | ICD-10-CM | POA: Diagnosis not present

## 2018-05-24 DIAGNOSIS — Z87891 Personal history of nicotine dependence: Secondary | ICD-10-CM | POA: Insufficient documentation

## 2018-05-24 HISTORY — DX: Herpesviral infection, unspecified: B00.9

## 2018-05-24 LAB — URINALYSIS, ROUTINE W REFLEX MICROSCOPIC
BILIRUBIN URINE: NEGATIVE
GLUCOSE, UA: NEGATIVE mg/dL
HGB URINE DIPSTICK: NEGATIVE
Ketones, ur: 5 mg/dL — AB
NITRITE: NEGATIVE
PH: 6 (ref 5.0–8.0)
Protein, ur: NEGATIVE mg/dL
SPECIFIC GRAVITY, URINE: 1.027 (ref 1.005–1.030)

## 2018-05-24 MED ORDER — AZITHROMYCIN 250 MG PO TABS
1000.0000 mg | ORAL_TABLET | Freq: Once | ORAL | Status: AC
  Administered 2018-05-24: 1000 mg via ORAL
  Filled 2018-05-24: qty 4

## 2018-05-24 MED ORDER — CEFTRIAXONE SODIUM 250 MG IJ SOLR
250.0000 mg | Freq: Once | INTRAMUSCULAR | Status: AC
  Administered 2018-05-24: 250 mg via INTRAMUSCULAR
  Filled 2018-05-24: qty 250

## 2018-05-24 MED ORDER — LIDOCAINE HCL (PF) 1 % IJ SOLN
INTRAMUSCULAR | Status: AC
  Administered 2018-05-24: 2 mL
  Filled 2018-05-24: qty 2

## 2018-05-24 NOTE — ED Provider Notes (Signed)
[facility] EMERGENCY DEPARTMENT Provider Note   CSN: [id number] Arrival date & time: 05/24/18  4098     History   Chief Complaint Chief Complaint  Patient presents with  . SEXUALLY TRANSMITTED DISEASE    HPI Karl Smith is a 22 y.o. male.  Patient reports unprotected intercourse with multiple partners within the past 1 to 2 months.  He reports some discomfort with urination, and has noted some [place] penile discharge from time to time.  He presents now requesting evaluation and assistance with this issue.  The history is provided by the patient.  Exposure to STD  This is a new problem. The current episode started yesterday. The problem has been gradually worsening. Pertinent negatives include no chest pain, no abdominal pain and no shortness of breath. Nothing aggravates the symptoms. Nothing relieves the symptoms. He has tried nothing for the symptoms.    Past Medical History:  Diagnosis Date  . Allergy    seasonal  . Heart murmur   . Herpes simplex type 1 infection   . High blood pressure   . Pneumonia 34 months old    Patient Active Problem List   Diagnosis Date Noted  . Elevated blood pressure 11/23/2012  . CLOSED FRACTURE OF BASE OF THUMB METACARPAL BONE 02/01/2008    Past Surgical History:  Procedure Laterality Date  . HAND SURGERY    . thumb surgery    . thumb surgery Right 7th grade        Home Medications    Prior to Admission medications   Medication Sig Start Date End Date Taking? Authorizing Provider  cetirizine (ZYRTEC) 10 MG tablet Take 10 mg by mouth daily.   Yes [provider]  fexofenadine (ALLEGRA) 180 MG tablet Take 180 mg by mouth daily.   Yes [provider]  albuterol (PROVENTIL HFA;VENTOLIN HFA) 108 (90 Base) MCG/ACT inhaler Inhale 2 puffs into the lungs every 6 (six) hours as needed for wheezing or shortness of breath. Patient not taking: Reported on 03/03/2018 06/11/16   Merlyn Albert, MD  amoxicillin  (AMOXIL) 500 MG capsule Take 1 capsule (500 mg total) by mouth 3 (three) times daily. Patient not taking: Reported on 03/03/2018 01/28/17   Merlyn Albert, MD  azithromycin (ZITHROMAX Z-PAK) 250 MG tablet Take 2 tablets (500 mg) on  Day 1,  followed by 1 tablet (250 mg) once daily on Days 2 through 5. Patient not taking: Reported on 03/03/2018 06/11/16   Merlyn Albert, MD  doxycycline (VIBRA-TABS) 100 MG tablet Take 1 tablet (100 mg total) by mouth 2 (two) times daily. Patient not taking: Reported on 03/03/2018 02/16/18   Merlyn Albert, MD  ibuprofen (ADVIL,MOTRIN) 800 MG tablet Take 1 tablet (800 mg total) by mouth 3 (three) times daily. Patient not taking: Reported on 03/03/2018 05/12/16   Elson Areas, PA-C  naproxen (NAPROSYN) 500 MG tablet Take 1 tablet (500 mg total) by mouth 2 (two) times daily as needed. Patient not taking: Reported on 03/03/2018 06/11/16   Merlyn Albert, MD  valACYclovir (VALTREX) 1000 MG tablet Take 2 tablets now and two tablets 12 hours later Patient not taking: Reported on 05/24/2018 03/06/18   Merlyn Albert, MD    Family History Family History  Problem Relation Age of Onset  . Depression Father   . Bipolar disorder Cousin     Social History Social History   Tobacco Use  . Smoking status: Former Smoker    Types: Cigarettes  .  Smokeless tobacco: Never Used  Substance Use Topics  . Alcohol use: Yes    Alcohol/week: 1.0 standard drinks    Types: 1 Cans of beer per week  . Drug use: No     Allergies   Patient has no known allergies.   Review of Systems Review of Systems  Constitutional: Negative for activity change.       All ROS Neg except as noted in HPI  HENT: Negative for nosebleeds.   Eyes: Negative for photophobia and discharge.  Respiratory: Negative for cough, shortness of breath and wheezing.   Cardiovascular: Negative for chest pain and palpitations.  Gastrointestinal: Negative for abdominal pain and blood in stool.    Genitourinary: Positive for discharge and dysuria. Negative for frequency and hematuria.  Musculoskeletal: Negative for arthralgias, back pain and neck pain.  Skin: Negative.   Neurological: Negative for dizziness, seizures and speech difficulty.  Psychiatric/Behavioral: Negative for confusion and hallucinations.     Physical Exam Updated Vital Signs BP 126/60 (BP Location: Right Arm)   Pulse (!) 58   Temp 98.2 F (36.8 C) (Oral)   Resp 16   Ht 5\' 9"  (1.753 m)   Wt 90.7 kg   SpO2 98%   BMI 29.53 kg/m   Physical Exam Vitals signs and nursing note reviewed.  Constitutional:      Appearance: He is well-developed. He is not toxic-appearing.  HENT:     Head: Normocephalic.     Right Ear: Tympanic membrane and external ear normal.     Left Ear: Tympanic membrane and external ear normal.  Eyes:     General: Lids are normal.     Pupils: Pupils are equal, round, and reactive to light.  Neck:     Musculoskeletal: Normal range of motion and neck supple.     Vascular: No carotid bruit.  Cardiovascular:     Rate and Rhythm: Normal rate and regular rhythm.     Pulses: Normal pulses.     Heart sounds: Normal heart sounds.  Pulmonary:     Effort: No respiratory distress.     Breath sounds: Normal breath sounds.  Abdominal:     General: Bowel sounds are normal.     Palpations: Abdomen is soft.     Tenderness: There is no abdominal tenderness. There is no guarding.  Genitourinary:    Pubic Area: No rash.      Penis: Circumcised. No tenderness or swelling.   Musculoskeletal: Normal range of motion.  Lymphadenopathy:     Head:     Right side of head: No submandibular adenopathy.     Left side of head: No submandibular adenopathy.     Cervical: No cervical adenopathy.     Lower Body: Right inguinal adenopathy present. Left inguinal adenopathy present.  Skin:    General: Skin is warm and dry.  Neurological:     Mental Status: He is alert and oriented to person, place, and time.      Cranial Nerves: No cranial nerve deficit.     Sensory: No sensory deficit.  Psychiatric:        Speech: Speech normal.      ED Treatments / Results  Labs (all labs ordered are listed, but only abnormal results are displayed) Labs Reviewed  URINALYSIS, ROUTINE W REFLEX MICROSCOPIC - Abnormal; Notable for the following components:      Result Value   Ketones, ur 5 (*)    Leukocytes, UA MODERATE (*)    Bacteria, UA RARE (*)  All other components within normal limits  HIV ANTIBODY (ROUTINE TESTING W REFLEX)  RPR  GC/CHLAMYDIA PROBE AMP ([place]) NOT AT [facility]    EKG None  Radiology No results found.  Procedures Procedures (including critical care time)  Medications Ordered in ED Medications  azithromycin (ZITHROMAX) tablet 1,000 mg (1,000 mg Oral Given 05/24/18 1057)  cefTRIAXone (ROCEPHIN) injection 250 mg (250 mg Intramuscular Given 05/24/18 1058)  lidocaine (PF) (XYLOCAINE) 1 % injection (2 mLs  Given 05/24/18 1058)     Initial Impression / Assessment and Plan / ED Course  I have reviewed the triage vital signs and the nursing notes.  Pertinent labs & imaging results that were available during my care of the patient were reviewed by me and considered in my medical decision making (see chart for details).       Final Clinical Impressions(s) / ED Diagnoses MDM  Vital signs reviewed.  Patient reports quite discharge from the penis from time to time.  He also reports unprotected intercourse with several partners over the last 1 to 2 months.  The patient was treated in the emergency department with intramuscular Rocephin and oral Zithromax.  Cultures were obtained.  HIV and syphilis testing was also contacted.  I have asked the patient to refrain from sexual activity over the next 7 days.  I have informed him of the need to practice safe sex.  Patient knowledges understanding of the plan as well as the instructions.   Final diagnoses:  Urethral discharge in  male    ED Discharge Orders    None       Ivery QualeBryant, Seydina Holliman, Cordelia Poche-C 05/24/18 1128    Derwood KaplanNanavati, Ankit, MD 05/25/18 [phone]

## 2018-05-24 NOTE — Discharge Instructions (Addendum)
Your urine test shows evidence of some bacteria present, and positive infection marker.  A culture of your urine has been sent to the lab.  You have been treated in the emergency department with intramuscular Zithromax to cover you for possible gonorrhea infections.  You have also been treated with oral Zithromax to treat for possible chlamydia infections.  You have been tested for HIV and syphilis.  If there are any abnormalities of your cultures or any of your tests, [facility] will call you with results as well as with instructions.  Please refrain from all sexual activity over the next 7 days.  Please practice safe sex.

## 2018-05-24 NOTE — ED Triage Notes (Signed)
Pt states he has been having [place] penile discharge first noticed yesterday.  Unprotected sex yesterday.  Has had multiple partners in the past month.

## 2018-05-25 LAB — HIV ANTIBODY (ROUTINE TESTING W REFLEX): HIV Screen 4th Generation wRfx: NONREACTIVE

## 2018-05-25 LAB — GC/CHLAMYDIA PROBE AMP (~~LOC~~) NOT AT ARMC
CHLAMYDIA, DNA PROBE: NEGATIVE
NEISSERIA GONORRHEA: POSITIVE — AB

## 2018-05-26 LAB — RPR: RPR: NONREACTIVE

## 2018-12-18 ENCOUNTER — Encounter: Payer: Self-pay | Admitting: Family Medicine

## 2018-12-18 ENCOUNTER — Ambulatory Visit (INDEPENDENT_AMBULATORY_CARE_PROVIDER_SITE_OTHER): Payer: BC Managed Care – PPO | Admitting: Family Medicine

## 2018-12-18 ENCOUNTER — Other Ambulatory Visit: Payer: Self-pay

## 2018-12-18 DIAGNOSIS — R300 Dysuria: Secondary | ICD-10-CM

## 2018-12-18 DIAGNOSIS — Z113 Encounter for screening for infections with a predominantly sexual mode of transmission: Secondary | ICD-10-CM

## 2018-12-18 NOTE — Progress Notes (Signed)
   Subjective:  Audio plus video  Patient ID: Karl Smith, male    DOB: 1995-08-24, 23 y.o.   MRN: [id number]  Exposure to STD Primary symptoms comment: milky type pus from tip of penis, abdominal cramps. Chronicity: pt tested positive for gonorrhea in Jan or Feb. He is sexually active.  pt states he has been with the same partner since January. Pt states he would like to get tested for everything. Pt tested positive for gonorrhea earlier this year.   Virtual Visit via Video Note  I connected with Karl Smith on 12/18/18 at 10:00 AM EDT by a video enabled telemedicine application and verified that I am speaking with the correct person using two identifiers.  Location: Patient: home Provider: office   I discussed the limitations of evaluation and management by telemedicine and the availability of in person appointments. The patient expressed understanding and agreed to proceed.  History of Present Illness:    Observations/Objective:   Assessment and Plan:   Follow Up Instructions:    I discussed the assessment and treatment plan with the patient. The patient was provided an opportunity to ask questions and all were answered. The patient agreed with the plan and demonstrated an understanding of the instructions.   The patient was advised to call back or seek an in-person evaluation if the symptoms worsen or if the condition fails to improve as anticipated.  I provided 18 minutes of non-face-to-face time during this encounter.   Vicente Males, LPN   Patient concerns symptomatology.  Slight discharge if he tries hard to express it.  Had gonorrhea back in [place].  Ongoing sexual interaction claims with just 1 partner.  Partner currently not with symptoms.  Patient is a bit anxious about all of this.  See prior notes   Review of Systems No headache, no major weight loss or weight gain, no chest pain no back pain abdominal pain no change in bowel habits complete ROS  otherwise negative     Objective:   Physical Exam  Virtual      Assessment & Plan:  Impression STD concerns.  Discussed.  Will once again do blood testing and urine testing for GC chlamydia.  Further recommendations based on results

## 2018-12-23 LAB — VDRL: Non-Treponemal Screening VDRL: NORMAL

## 2018-12-23 LAB — HIV ANTIBODY (ROUTINE TESTING W REFLEX): HIV Screen 4th Generation wRfx: NONREACTIVE

## 2018-12-25 LAB — GC/CHLAMYDIA PROBE AMP
Chlamydia trachomatis, NAA: NEGATIVE
Neisseria Gonorrhoeae by PCR: NEGATIVE

## 2019-01-04 NOTE — Addendum Note (Signed)
Addended by: Dairl Ponder on: 01/04/2019 02:10 PM   Modules accepted: Orders

## 2019-01-12 ENCOUNTER — Encounter: Payer: Self-pay | Admitting: Family Medicine

## 2019-03-20 ENCOUNTER — Encounter (HOSPITAL_COMMUNITY): Payer: Self-pay | Admitting: Emergency Medicine

## 2019-03-20 ENCOUNTER — Emergency Department (HOSPITAL_COMMUNITY)
Admission: EM | Admit: 2019-03-20 | Discharge: 2019-03-20 | Disposition: A | Discharge status: Home / Self Care | Payer: BC Managed Care – PPO | Attending: Emergency Medicine | Admitting: Emergency Medicine

## 2019-03-20 ENCOUNTER — Emergency Department (HOSPITAL_COMMUNITY): Payer: BC Managed Care – PPO

## 2019-03-20 ENCOUNTER — Other Ambulatory Visit: Payer: Self-pay

## 2019-03-20 DIAGNOSIS — R062 Wheezing: Secondary | ICD-10-CM | POA: Insufficient documentation

## 2019-03-20 DIAGNOSIS — J029 Acute pharyngitis, unspecified: Secondary | ICD-10-CM | POA: Insufficient documentation

## 2019-03-20 DIAGNOSIS — Z79899 Other long term (current) drug therapy: Secondary | ICD-10-CM | POA: Insufficient documentation

## 2019-03-20 DIAGNOSIS — Z20828 Contact with and (suspected) exposure to other viral communicable diseases: Secondary | ICD-10-CM | POA: Diagnosis not present

## 2019-03-20 DIAGNOSIS — R0981 Nasal congestion: Secondary | ICD-10-CM | POA: Insufficient documentation

## 2019-03-20 DIAGNOSIS — R05 Cough: Secondary | ICD-10-CM | POA: Insufficient documentation

## 2019-03-20 DIAGNOSIS — Z87891 Personal history of nicotine dependence: Secondary | ICD-10-CM | POA: Diagnosis not present

## 2019-03-20 DIAGNOSIS — R059 Cough, unspecified: Secondary | ICD-10-CM

## 2019-03-20 MED ORDER — AEROCHAMBER PLUS FLO-VU MEDIUM MISC
1.0000 | Freq: Once | Status: AC
  Administered 2019-03-20: 1
  Filled 2019-03-20 (×2): qty 1

## 2019-03-20 MED ORDER — ALBUTEROL SULFATE HFA 108 (90 BASE) MCG/ACT IN AERS
4.0000 | INHALATION_SPRAY | Freq: Once | RESPIRATORY_TRACT | Status: AC
  Administered 2019-03-20: 20:00:00 4 via RESPIRATORY_TRACT
  Filled 2019-03-20: qty 6.7

## 2019-03-20 MED ORDER — BENZONATATE 100 MG PO CAPS: 100.0000 mg | ORAL_CAPSULE | Freq: Three times a day (TID) | ORAL | 0 refills | Status: DC

## 2019-03-20 MED ORDER — FLUTICASONE PROPIONATE 50 MCG/ACT NA SUSP: 1.0000 | Freq: Every day | NASAL | 0 refills | Status: DC

## 2019-03-20 NOTE — ED Triage Notes (Signed)
Patient complains of cough and congestion. NAD  Denies being around anyone who is sick.

## 2019-03-20 NOTE — Discharge Instructions (Signed)
You were seen in the ER today for cough.  Your chest x-ray was normal.  We sent a Covid swab, will call you if this is positive, you need to quarantine in the meantime.  If positive you will need to quarantine for 14 days.   We are sending you home with an inhaler to use 1 to 2 puffs every 4-6 hours as needed for trouble breathing/wheezing, Tessalon to take every 8 hours as needed for coughing, and Flonase to use daily as needed for nasal congestion.  We have prescribed you new medication(s) today. Discuss the medications prescribed today with your pharmacist as they can have adverse effects and interactions with your other medicines including over the counter and prescribed medications. Seek medical evaluation if you start to experience new or abnormal symptoms after taking one of these medicines, seek care immediately if you start to experience difficulty breathing, feeling of your throat closing, facial swelling, or rash as these could be indications of a more serious allergic reaction  Please follow-up with your primary care provider within 3 to 5 days for reevaluation.  Return to the ER for new or worsening symptoms including but not limited to shortness of breath, chest pain, passing out, fever, or any other concerns.

## 2019-03-20 NOTE — ED Provider Notes (Signed)
[facility] EMERGENCY DEPARTMENT Provider Note   CSN: [id number] Arrival date & time: 03/20/19  1830     History   Chief Complaint Chief Complaint  Patient presents with  . Cough    HPI Karl Smith is a 23 y.o. male with a hx of HSV1, prior pneumonia, & elevated BP who presents to the ED w/ complaints of cough for the past few days. Patient states he has had nasal congestion & cough productive of mucous sputum- states sometimes he feels it is hard to get the mucous up. Feels he is wheezing and relays his throat feels sore from coughing so much. No alleviating/aggravating factors. No intervention PTA. Denies fever, chills, dyspnea, chest pain, abdominal pain, or vomiting. Denies known sick contacts w/ similar sxs.     HPI  Past Medical History:  Diagnosis Date  . Allergy    seasonal  . Heart murmur   . Herpes simplex type 1 infection   . High blood pressure   . Pneumonia 2518 months old    Patient Active Problem List   Diagnosis Date Noted  . Elevated blood pressure 11/23/2012  . CLOSED FRACTURE OF BASE OF THUMB METACARPAL BONE 02/01/2008    Past Surgical History:  Procedure Laterality Date  . HAND SURGERY    . thumb surgery    . thumb surgery Right 7th grade        Home Medications    Prior to Admission medications   Medication Sig Start Date End Date Taking? Authorizing Provider  albuterol (PROVENTIL HFA;VENTOLIN HFA) 108 (90 Base) MCG/ACT inhaler Inhale 2 puffs into the lungs every 6 (six) hours as needed for wheezing or shortness of breath. 06/11/16   Merlyn AlbertLuking, William S, MD  amoxicillin (AMOXIL) 500 MG capsule Take 1 capsule (500 mg total) by mouth 3 (three) times daily. 01/28/17   Merlyn AlbertLuking, William S, MD  azithromycin (ZITHROMAX Z-PAK) 250 MG tablet Take 2 tablets (500 mg) on  Day 1,  followed by 1 tablet (250 mg) once daily on Days 2 through 5. 06/11/16   Merlyn AlbertLuking, William S, MD  cetirizine (ZYRTEC) 10 MG tablet Take 10 mg by mouth daily.    [provider]  doxycycline (VIBRA-TABS) 100 MG tablet Take 1 tablet (100 mg total) by mouth 2 (two) times daily. 02/16/18   Merlyn AlbertLuking, William S, MD  fexofenadine (ALLEGRA) 180 MG tablet Take 180 mg by mouth daily.    [provider]  ibuprofen (ADVIL,MOTRIN) 800 MG tablet Take 1 tablet (800 mg total) by mouth 3 (three) times daily. 05/12/16   Elson AreasSofia, Leslie K, PA-C  naproxen (NAPROSYN) 500 MG tablet Take 1 tablet (500 mg total) by mouth 2 (two) times daily as needed. 06/11/16   Merlyn AlbertLuking, William S, MD  valACYclovir (VALTREX) 1000 MG tablet Take 2 tablets now and two tablets 12 hours later 03/06/18   Merlyn AlbertLuking, William S, MD    Family History Family History  Problem Relation Age of Onset  . Depression Father   . Bipolar disorder Cousin     Social History Social History   Tobacco Use  . Smoking status: Former Smoker    Types: Cigarettes  . Smokeless tobacco: Never Used  Substance Use Topics  . Alcohol use: Yes    Alcohol/week: 1.0 standard drinks    Types: 1 Cans of beer per week  . Drug use: No     Allergies   Patient has no known allergies.   Review of Systems Review of Systems  Constitutional: Negative for chills and fever.  HENT: Positive for congestion and sore throat. Negative for ear pain.   Respiratory: Positive for cough and wheezing. Negative for shortness of breath.   Cardiovascular: Negative for chest pain and leg swelling.  Gastrointestinal: Negative for abdominal pain, diarrhea and vomiting.  Neurological: Negative for syncope.    Physical Exam Updated Vital Signs BP 126/64 (BP Location: Right Arm)   Pulse (!) 55   Temp 98.3 F (36.8 C) (Oral)   Resp 16   Ht 5\' 9"  (1.753 m)   Wt 88.5 kg   SpO2 100%   BMI 28.80 kg/m   Physical Exam Vitals signs and nursing note reviewed.  Constitutional:      General: He is not in acute distress.    Appearance: He is well-developed. He is not toxic-appearing.  HENT:     Head: Normocephalic and atraumatic.     Right Ear:  Ear canal normal. Tympanic membrane is not perforated, erythematous, retracted or bulging.     Left Ear: Ear canal normal. Tympanic membrane is not perforated, erythematous, retracted or bulging.     Ears:     Comments: No mastoid erythema/swelling/tenderness.     Nose: Congestion present.     Right Sinus: No maxillary sinus tenderness or frontal sinus tenderness.     Left Sinus: No maxillary sinus tenderness or frontal sinus tenderness.     Mouth/Throat:     Mouth: Mucous membranes are moist.     Pharynx: Uvula midline. No oropharyngeal exudate or posterior oropharyngeal erythema.     Comments: Posterior oropharynx is symmetric appearing. Patient tolerating own secretions without difficulty. No trismus. No drooling. No hot potato voice. No swelling beneath the tongue, submandibular compartment is soft.  Eyes:     General:        Right eye: No discharge.        Left eye: No discharge.     Conjunctiva/sclera: Conjunctivae normal.  Neck:     Musculoskeletal: Neck supple. No neck rigidity.  Cardiovascular:     Rate and Rhythm: Normal rate and regular rhythm.  Pulmonary:     Effort: Pulmonary effort is normal. No tachypnea, accessory muscle usage or respiratory distress.     Breath sounds: Wheezing (expiratory scattered throughout) present. No rhonchi or rales.  Abdominal:     General: There is no distension.     Palpations: Abdomen is soft.     Tenderness: There is no abdominal tenderness.  Lymphadenopathy:     Cervical: No cervical adenopathy.  Skin:    General: Skin is warm and dry.     Findings: No rash.  Neurological:     Mental Status: He is alert.     Comments: Clear speech.   Psychiatric:        Behavior: Behavior normal.      ED Treatments / Results  Labs (all labs ordered are listed, but only abnormal results are displayed) Labs Reviewed  NOVEL CORONAVIRUS, NAA (HOSP ORDER, SEND-OUT TO REF LAB; TAT 18-24 HRS)    EKG None  Radiology Dg Chest Port 1 View   Result Date: 03/20/2019 CLINICAL DATA:  Cough. EXAM: PORTABLE CHEST 1 VIEW COMPARISON:  August 16, 2014 FINDINGS: The heart size and mediastinal contours are within normal limits. Both lungs are clear. The visualized skeletal structures are unremarkable. IMPRESSION: No active disease. Electronically Signed   By: Dorise Bullion III M.D   On: 03/20/2019 19:55    Procedures Procedures (including critical care time)  Medications Ordered in ED Medications  albuterol (VENTOLIN HFA) 108 (90 Base) MCG/ACT inhaler 4 puff (4 puffs Inhalation Given 03/20/19 1948)  AeroChamber Plus Flo-Vu Medium MISC 1 each (1 each Other Given 03/20/19 1949)     Initial Impression / Assessment and Plan / ED Course  I have reviewed the triage vital signs and the nursing notes.  Pertinent labs & imaging results that were available during my care of the patient were reviewed by me and considered in my medical decision making (see chart for details).   Patient presents to the ED with nasal congestion, coughing & wheezing. Nontoxic appearing, resting comfortably, vitals without significant abnormality. Afebrile, no sinus tenderness- doubt acute bacterial sinusitis. TMs clear. Centor 0- doubt strep. No signs of RPA/PTA. CXR w/ infiltrate to suggest pneumonia. Wheezing cleared s/p albuterol inhaler- patient feeling improved. No hx of asthma to indicate asthma exacerbation especially after cleared wheezing after albuterol use x 1. Likely viral bronchitis, COVID swab sent. Supportive tx, discussed quarantine, follow up, & return precautions. I discussed results, treatment plan, need for follow-up, and return precautions with the patient. Provided opportunity for questions, patient confirmed understanding and is in agreement with plan.   Final Clinical Impressions(s) / ED Diagnoses   Final diagnoses:  Cough    ED Discharge Orders         Ordered    fluticasone (FLONASE) 50 MCG/ACT nasal spray  Daily     03/20/19 2039     benzonatate (TESSALON) 100 MG capsule  Every 8 hours     03/20/19 2039           Cherly Anderson, PA-C 03/20/19 2040    Vanetta Mulders, MD 03/30/19 [phone]

## 2019-03-22 LAB — NOVEL CORONAVIRUS, NAA (HOSP ORDER, SEND-OUT TO REF LAB; TAT 18-24 HRS): SARS-CoV-2, NAA: NOT DETECTED

## 2019-03-25 ENCOUNTER — Telehealth: Payer: Self-pay | Admitting: Family Medicine

## 2019-03-25 ENCOUNTER — Ambulatory Visit (INDEPENDENT_AMBULATORY_CARE_PROVIDER_SITE_OTHER): Payer: BC Managed Care – PPO | Admitting: Family Medicine

## 2019-03-25 DIAGNOSIS — J4521 Mild intermittent asthma with (acute) exacerbation: Secondary | ICD-10-CM

## 2019-03-25 DIAGNOSIS — J310 Chronic rhinitis: Secondary | ICD-10-CM

## 2019-03-25 DIAGNOSIS — J329 Chronic sinusitis, unspecified: Secondary | ICD-10-CM

## 2019-03-25 MED ORDER — PREDNISONE 20 MG PO TABS: ORAL_TABLET | ORAL | 0 refills | Status: DC

## 2019-03-25 MED ORDER — AMOXICILLIN-POT CLAVULANATE 875-125 MG PO TABS: 1.0000 | ORAL_TABLET | Freq: Two times a day (BID) | ORAL | 0 refills | Status: AC

## 2019-03-25 NOTE — Progress Notes (Signed)
   Subjective:    Patient ID: Karl Smith, male    DOB: 1995/08/29, 23 y.o.   MRN: [id number]  Cough This is a new problem. The current episode started in the past 7 days. Associated symptoms include headaches, nasal congestion and a sore throat.   Went to ER and was Covid Negative   Review of Systems  HENT: Positive for sore throat.   Respiratory: Positive for cough.   Neurological: Positive for headaches.      Virtual Visit via Video Note  I connected with Karl Smith on 03/25/19 at  1:10 PM EDT by a video enabled telemedicine application and verified that I am speaking with the correct person using two identifiers.  Location: Patient: home Provider: office   I discussed the limitations of evaluation and management by telemedicine and the availability of in person appointments. The patient expressed understanding and agreed to proceed.  History of Present Illness:    Observations/Objective:   Assessment and Plan:   Follow Up Instructions:    I discussed the assessment and treatment plan with the patient. The patient was provided an opportunity to ask questions and all were answered. The patient agreed with the plan and demonstrated an understanding of the instructions.   The patient was advised to call back or seek an in-person evaluation if the symptoms worsen or if the condition fails to improve as anticipated.  I provided 18 minutes of non-face-to-face time during this encounter.  Patient last week had a COVID-19 test due to viral symptomatology.  Now has settled into the chest.  Tight.  Wheezy.  Using inhaler as needed.  Cough not productive phlegm.  No fever no chills.   Objective:   Physical Exam  Virtual      Assessment & Plan:  Impression post viral bronchitis with exacerbation of asthma.  Antibiotics prescribed.  Steroids prescribed.  Maintain albuterol symptom care discussed warning signs discussed

## 2019-03-25 NOTE — Telephone Encounter (Signed)
° °  Pt rec ned COVID  Results

## 2019-03-26 ENCOUNTER — Encounter: Payer: Self-pay | Admitting: Family Medicine

## 2019-03-28 ENCOUNTER — Encounter: Payer: Self-pay | Admitting: Family Medicine

## 2019-06-30 ENCOUNTER — Encounter: Payer: Self-pay | Admitting: Family Medicine

## 2019-07-01 ENCOUNTER — Encounter: Payer: Self-pay | Admitting: Family Medicine

## 2019-12-31 ENCOUNTER — Ambulatory Visit (INDEPENDENT_AMBULATORY_CARE_PROVIDER_SITE_OTHER): Payer: BC Managed Care – PPO | Admitting: Family Medicine

## 2019-12-31 ENCOUNTER — Encounter: Payer: Self-pay | Admitting: Family Medicine

## 2019-12-31 ENCOUNTER — Other Ambulatory Visit: Payer: Self-pay

## 2019-12-31 VITALS — HR 73 | Temp 101.0°F | Resp 16

## 2019-12-31 DIAGNOSIS — J020 Streptococcal pharyngitis: Secondary | ICD-10-CM | POA: Diagnosis not present

## 2019-12-31 DIAGNOSIS — H9202 Otalgia, left ear: Secondary | ICD-10-CM

## 2019-12-31 LAB — POCT RAPID STREP A (OFFICE): Rapid Strep A Screen: POSITIVE — AB

## 2019-12-31 MED ORDER — AMOXICILLIN 500 MG PO CAPS: 500.0000 mg | ORAL_CAPSULE | Freq: Two times a day (BID) | ORAL | 0 refills | Status: DC

## 2019-12-31 NOTE — Progress Notes (Signed)
Patient ID: Karl Smith, male    DOB: 08/14/1995, 24 y.o.   MRN: [id number]   Chief Complaint  Patient presents with  . Sore Throat   Subjective:    HPI  Pt seen as car visit. Pt having sinus issues with drainage down throat. States that this is an ongoing issue but has worsened over the past week. Having drainage, cough with brown mucus, slight headache, raspy voice.   Pt working in dusty environment with loading trucks.  Pt not been taking the flonase an zyrtec like in past. Bought some this week. Not having fever.  Has sore throat, left ear pian, cough with some sputum, voice is raspy.  Not had any sick contacts. Not had covid vaccine. Using masking at work and in public.  Medical History Thad has a past medical history of Allergy, Heart murmur, Herpes simplex type 1 infection, High blood pressure, and Pneumonia (18 months old).   Outpatient Encounter Medications as of 12/31/2019  Medication Sig  . amoxicillin (AMOXIL) 500 MG capsule Take 1 capsule (500 mg total) by mouth 2 (two) times daily.  . [DISCONTINUED] albuterol (PROVENTIL HFA;VENTOLIN HFA) 108 (90 Base) MCG/ACT inhaler Inhale 2 puffs into the lungs every 6 (six) hours as needed for wheezing or shortness of breath.  . [DISCONTINUED] cetirizine (ZYRTEC) 10 MG tablet Take 10 mg by mouth daily.  . [DISCONTINUED] fexofenadine (ALLEGRA) 180 MG tablet Take 180 mg by mouth daily.  . [DISCONTINUED] fluticasone (FLONASE) 50 MCG/ACT nasal spray Place 1 spray into both nostrils daily.  . [DISCONTINUED] ibuprofen (ADVIL,MOTRIN) 800 MG tablet Take 1 tablet (800 mg total) by mouth 3 (three) times daily.  . [DISCONTINUED] naproxen (NAPROSYN) 500 MG tablet Take 1 tablet (500 mg total) by mouth 2 (two) times daily as needed.  . [DISCONTINUED] predniSONE (DELTASONE) 20 MG tablet 3qd for 3d then 2qd for 3d then 1qd for 3d  . [DISCONTINUED] valACYclovir (VALTREX) 1000 MG tablet Take 2 tablets now and two tablets 12 hours  later   No facility-administered encounter medications on file as of 12/31/2019.     Review of Systems  Constitutional: Negative for chills and fever.  HENT: Positive for congestion, ear pain, postnasal drip, sore throat and voice change. Negative for rhinorrhea.   Respiratory: Positive for cough. Negative for shortness of breath and wheezing.   Cardiovascular: Negative for chest pain and leg swelling.  Gastrointestinal: Negative for abdominal pain, diarrhea, nausea and vomiting.  Genitourinary: Negative for dysuria and frequency.  Skin: Negative for rash.  Neurological: Negative for dizziness, weakness and headaches.     Vitals Pulse 73   Temp (!) 101 F (38.3 C) (Temporal) Comment (Src): pt sitting in hot car  Resp 16   SpO2 97%   Objective:   Physical Exam Vitals and nursing note reviewed.  Constitutional:      General: He is not in acute distress.    Appearance: Normal appearance. He is not ill-appearing.  HENT:     Head: Normocephalic and atraumatic.     Right Ear: Tympanic membrane, ear canal and external ear normal.     Left Ear: Tympanic membrane, ear canal and external ear normal.     Nose: Nose normal. No congestion or rhinorrhea.     Mouth/Throat:     Mouth: Mucous membranes are moist. No oral lesions.     Pharynx: No pharyngeal swelling, oropharyngeal exudate or posterior oropharyngeal erythema.     Tonsils: No tonsillar exudate or tonsillar abscesses.  Comments: +cobblestoning in oropharynx. Eyes:     Extraocular Movements: Extraocular movements intact.     Conjunctiva/sclera: Conjunctivae normal.     Pupils: Pupils are equal, round, and reactive to light.  Cardiovascular:     Rate and Rhythm: Normal rate and regular rhythm.     Heart sounds: Normal heart sounds. No murmur heard.   Pulmonary:     Effort: Pulmonary effort is normal. No respiratory distress.     Breath sounds: Normal breath sounds. No wheezing, rhonchi or rales.  Musculoskeletal:      Cervical back: Normal range of motion.  Skin:    General: Skin is warm and dry.     Findings: No rash.  Neurological:     General: No focal deficit present.     Mental Status: He is alert and oriented to person, place, and time.      Assessment and Plan   1. Strep pharyngitis - POCT rapid strep A - amoxicillin (AMOXIL) 500 MG capsule; Take 1 capsule (500 mg total) by mouth 2 (two) times daily.  Dispense: 20 capsule; Refill: 0  2. Ear pain, left   For throat pain, take tylenol, ibuprofen, salt water gargles and throat losenges.  Take amoxicillin until finished.   Call or rto if worsening.  F/u prn.

## 2020-01-13 DIAGNOSIS — K006 Disturbances in tooth eruption: Secondary | ICD-10-CM | POA: Diagnosis not present

## 2020-03-21 ENCOUNTER — Other Ambulatory Visit: Payer: Self-pay

## 2020-03-21 ENCOUNTER — Ambulatory Visit: Payer: BC Managed Care – PPO | Admitting: Family Medicine

## 2020-03-21 ENCOUNTER — Encounter: Payer: Self-pay | Admitting: Family Medicine

## 2020-03-21 VITALS — BP 128/78 | HR 100 | Temp 97.1°F | Wt 210.2 lb

## 2020-03-21 DIAGNOSIS — I10 Essential (primary) hypertension: Secondary | ICD-10-CM

## 2020-03-21 NOTE — Patient Instructions (Addendum)
Start to monitoring your blood pressure at least once per day. Keep a log.  Do it at different times per day. Watch the sodium in your diet- continue cooking at home. Increase water intake Cut back, to stop smoking cigarettes!! Limit alcohol use. Walk with the goal of 150 minutes per week.  Bring Blood pressure log at follow-up visit.      Managing Your Hypertension Hypertension is commonly called high blood pressure. This is when the force of your blood pressing against the walls of your arteries is too strong. Arteries are blood vessels that carry blood from your heart throughout your body. Hypertension forces the heart to work harder to pump blood, and may cause the arteries to become narrow or stiff. Having untreated or uncontrolled hypertension can cause heart attack, stroke, kidney disease, and other problems. What are blood pressure readings? A blood pressure reading consists of a higher number over a lower number. Ideally, your blood pressure should be below 120/80. The first ("top") number is called the systolic pressure. It is a measure of the pressure in your arteries as your heart beats. The second ("bottom") number is called the diastolic pressure. It is a measure of the pressure in your arteries as the heart relaxes. What does my blood pressure reading mean? Blood pressure is classified into four stages. Based on your blood pressure reading, your health care provider may use the following stages to determine what type of treatment you need, if any. Systolic pressure and diastolic pressure are measured in a unit called mm Hg. Normal  Systolic pressure: below 120.  Diastolic pressure: below 80. Elevated  Systolic pressure: 120-129.  Diastolic pressure: below 80. Hypertension stage 1  Systolic pressure: 130-139.  Diastolic pressure: 80-89. Hypertension stage 2  Systolic pressure: 140 or above.  Diastolic pressure: 90 or above. What health risks are associated with  hypertension? Managing your hypertension is an important responsibility. Uncontrolled hypertension can lead to:  A heart attack.  A stroke.  A weakened blood vessel (aneurysm).  Heart failure.  Kidney damage.  Eye damage.  Metabolic syndrome.  Memory and concentration problems. What changes can I make to manage my hypertension? Hypertension can be managed by making lifestyle changes and possibly by taking medicines. Your health care provider will help you make a plan to bring your blood pressure within a normal range. Eating and drinking   Eat a diet that is high in fiber and potassium, and low in salt (sodium), added sugar, and fat. An example eating plan is called the DASH (Dietary Approaches to Stop Hypertension) diet. To eat this way: ? Eat plenty of fresh fruits and vegetables. Try to fill half of your plate at each meal with fruits and vegetables. ? Eat whole grains, such as whole wheat pasta, brown rice, or whole grain bread. Fill about one quarter of your plate with whole grains. ? Eat low-fat diary products. ? Avoid fatty cuts of meat, processed or cured meats, and poultry with skin. Fill about one quarter of your plate with lean proteins such as fish, chicken without skin, beans, eggs, and tofu. ? Avoid premade and processed foods. These tend to be higher in sodium, added sugar, and fat.  Reduce your daily sodium intake. Most people with hypertension should eat less than 1,500 mg of sodium a day.  Limit alcohol intake to no more than 1 drink a day for nonpregnant women and 2 drinks a day for men. One drink equals 12 oz of beer, 5 oz  of wine, or 1 oz of hard liquor. Lifestyle  Work with your health care provider to maintain a healthy body weight, or to lose weight. Ask what an ideal weight is for you.  Get at least 30 minutes of exercise that causes your heart to beat faster (aerobic exercise) most days of the week. Activities may include walking, swimming, or  biking.  Include exercise to strengthen your muscles (resistance exercise), such as weight lifting, as part of your weekly exercise routine. Try to do these types of exercises for 30 minutes at least 3 days a week.  Do not use any products that contain nicotine or tobacco, such as cigarettes and e-cigarettes. If you need help quitting, ask your health care provider.  Control any long-term (chronic) conditions you have, such as high cholesterol or diabetes. Monitoring  Monitor your blood pressure at home as told by your health care provider. Your personal target blood pressure may vary depending on your medical conditions, your age, and other factors.  Have your blood pressure checked regularly, as often as told by your health care provider. Working with your health care provider  Review all the medicines you take with your health care provider because there may be side effects or interactions.  Talk with your health care provider about your diet, exercise habits, and other lifestyle factors that may be contributing to hypertension.  Visit your health care provider regularly. Your health care provider can help you create and adjust your plan for managing hypertension. Will I need medicine to control my blood pressure? Your health care provider may prescribe medicine if lifestyle changes are not enough to get your blood pressure under control, and if:  Your systolic blood pressure is 130 or higher.  Your diastolic blood pressure is 80 or higher. Take medicines only as told by your health care provider. Follow the directions carefully. Blood pressure medicines must be taken as prescribed. The medicine does not work as well when you skip doses. Skipping doses also puts you at risk for problems. Contact a health care provider if:  You think you are having a reaction to medicines you have taken.  You have repeated (recurrent) headaches.  You feel dizzy.  You have swelling in your  ankles.  You have trouble with your vision. Get help right away if:  You develop a severe headache or confusion.  You have unusual weakness or numbness, or you feel faint.  You have severe pain in your chest or abdomen.  You vomit repeatedly.  You have trouble breathing. Summary  Hypertension is when the force of blood pumping through your arteries is too strong. If this condition is not controlled, it may put you at risk for serious complications.  Your personal target blood pressure may vary depending on your medical conditions, your age, and other factors. For most people, a normal blood pressure is less than 120/80.  Hypertension is managed by lifestyle changes, medicines, or both. Lifestyle changes include weight loss, eating a healthy, low-sodium diet, exercising more, and limiting alcohol. This information is not intended to replace advice given to you by your health care provider. Make sure you discuss any questions you have with your health care provider. Document Revised: 09/11/2018 Document Reviewed: 04/17/2016 Elsevier Patient Education  2020 [employer] DASH Eating Plan DASH stands for "Dietary Approaches to Stop Hypertension." The DASH eating plan is a healthy eating plan that has been shown to reduce high blood pressure (hypertension). It may also reduce your risk for  type 2 diabetes, heart disease, and stroke. The DASH eating plan may also help with weight loss. What are tips for following this plan?  General guidelines  Avoid eating more than 2,300 mg (milligrams) of salt (sodium) a day. If you have hypertension, you may need to reduce your sodium intake to 1,500 mg a day.  Limit alcohol intake to no more than 1 drink a day for nonpregnant women and 2 drinks a day for men. One drink equals 12 oz of beer, 5 oz of wine, or 1 oz of hard liquor.  Work with your health care provider to maintain a healthy body weight or to lose weight. Ask what an ideal weight is for  you.  Get at least 30 minutes of exercise that causes your heart to beat faster (aerobic exercise) most days of the week. Activities may include walking, swimming, or biking.  Work with your health care provider or diet and nutrition specialist (dietitian) to adjust your eating plan to your individual calorie needs. Reading food labels   Check food labels for the amount of sodium per serving. Choose foods with less than 5 percent of the Daily Value of sodium. Generally, foods with less than 300 mg of sodium per serving fit into this eating plan.  To find whole grains, look for the word "whole" as the first word in the ingredient list. Shopping  Buy products labeled as "low-sodium" or "no salt added."  Buy fresh foods. Avoid canned foods and premade or frozen meals. Cooking  Avoid adding salt when cooking. Use salt-free seasonings or herbs instead of table salt or sea salt. Check with your health care provider or pharmacist before using salt substitutes.  Do not fry foods. Cook foods using healthy methods such as baking, boiling, grilling, and broiling instead.  Cook with heart-healthy oils, such as olive, canola, soybean, or sunflower oil. Meal planning  Eat a balanced diet that includes: ? 5 or more servings of fruits and vegetables each day. At each meal, try to fill half of your plate with fruits and vegetables. ? Up to 6-8 servings of whole grains each day. ? Less than 6 oz of lean meat, poultry, or fish each day. A 3-oz serving of meat is about the same size as a deck of cards. One egg equals 1 oz. ? 2 servings of low-fat dairy each day. ? A serving of nuts, seeds, or beans 5 times each week. ? Heart-healthy fats. Healthy fats called Omega-3 fatty acids are found in foods such as flaxseeds and coldwater fish, like sardines, salmon, and mackerel.  Limit how much you eat of the following: ? Canned or prepackaged foods. ? Food that is high in trans fat, such as fried  foods. ? Food that is high in saturated fat, such as fatty meat. ? Sweets, desserts, sugary drinks, and other foods with added sugar. ? Full-fat dairy products.  Do not salt foods before eating.  Try to eat at least 2 vegetarian meals each week.  Eat more home-cooked food and less restaurant, buffet, and fast food.  When eating at a restaurant, ask that your food be prepared with less salt or no salt, if possible. What foods are recommended? The items listed may not be a complete list. Talk with your dietitian about what dietary choices are best for you. Grains Whole-grain or whole-wheat bread. Whole-grain or whole-wheat pasta. Brown rice. Orpah Cobb. Bulgur. Whole-grain and low-sodium cereals. Pita bread. Low-fat, low-sodium crackers. Whole-wheat flour tortillas. Vegetables Fresh or  frozen vegetables (raw, steamed, roasted, or grilled). Low-sodium or reduced-sodium tomato and vegetable juice. Low-sodium or reduced-sodium tomato sauce and tomato paste. Low-sodium or reduced-sodium canned vegetables. Fruits All fresh, dried, or frozen fruit. Canned fruit in natural juice (without added sugar). Meat and other protein foods Skinless chicken or [place]. Ground chicken or [place]. Pork with fat trimmed off. Fish and seafood. Egg whites. Dried beans, peas, or lentils. Unsalted nuts, nut butters, and seeds. Unsalted canned beans. Lean cuts of beef with fat trimmed off. Low-sodium, lean deli meat. Dairy Low-fat (1%) or fat-free (skim) milk. Fat-free, low-fat, or reduced-fat cheeses. Nonfat, low-sodium ricotta or cottage cheese. Low-fat or nonfat yogurt. Low-fat, low-sodium cheese. Fats and oils Soft margarine without trans fats. Vegetable oil. Low-fat, reduced-fat, or light mayonnaise and salad dressings (reduced-sodium). Canola, safflower, olive, soybean, and sunflower oils. Avocado. Seasoning and other foods Herbs. Spices. Seasoning mixes without salt. Unsalted popcorn and pretzels. Fat-free  sweets. What foods are not recommended? The items listed may not be a complete list. Talk with your dietitian about what dietary choices are best for you. Grains Baked goods made with fat, such as croissants, muffins, or some breads. Dry pasta or rice meal packs. Vegetables Creamed or fried vegetables. Vegetables in a cheese sauce. Regular canned vegetables (not low-sodium or reduced-sodium). Regular canned tomato sauce and paste (not low-sodium or reduced-sodium). Regular tomato and vegetable juice (not low-sodium or reduced-sodium). Rosita Fire. Olives. Fruits Canned fruit in a light or heavy syrup. Fried fruit. Fruit in cream or butter sauce. Meat and other protein foods Fatty cuts of meat. Ribs. Fried meat. Tomasa Blase. Sausage. Bologna and other processed lunch meats. Salami. Fatback. Hotdogs. Bratwurst. Salted nuts and seeds. Canned beans with added salt. Canned or smoked fish. Whole eggs or egg yolks. Chicken or [place] with skin. Dairy Whole or 2% milk, cream, and half-and-half. Whole or full-fat cream cheese. Whole-fat or sweetened yogurt. Full-fat cheese. Nondairy creamers. Whipped toppings. Processed cheese and cheese spreads. Fats and oils Butter. Stick margarine. Lard. Shortening. Ghee. Bacon fat. Tropical oils, such as coconut, palm kernel, or palm oil. Seasoning and other foods Salted popcorn and pretzels. Onion salt, garlic salt, seasoned salt, table salt, and sea salt. Worcestershire sauce. Tartar sauce. Barbecue sauce. Teriyaki sauce. Soy sauce, including reduced-sodium. Steak sauce. Canned and packaged gravies. Fish sauce. Oyster sauce. Cocktail sauce. Horseradish that you find on the shelf. Ketchup. Mustard. Meat flavorings and tenderizers. Bouillon cubes. Hot sauce and Tabasco sauce. Premade or packaged marinades. Premade or packaged taco seasonings. Relishes. Regular salad dressings. Where to find more information:  National Heart, Lung, and Blood Institute:  PopSteam.is  American Heart Association: www.heart.org Summary  The DASH eating plan is a healthy eating plan that has been shown to reduce high blood pressure (hypertension). It may also reduce your risk for type 2 diabetes, heart disease, and stroke.  With the DASH eating plan, you should limit salt (sodium) intake to 2,300 mg a day. If you have hypertension, you may need to reduce your sodium intake to 1,500 mg a day.  When on the DASH eating plan, aim to eat more fresh fruits and vegetables, whole grains, lean proteins, low-fat dairy, and heart-healthy fats.  Work with your health care provider or diet and nutrition specialist (dietitian) to adjust your eating plan to your individual calorie needs. This information is not intended to replace advice given to you by your health care provider. Make sure you discuss any questions you have with your health care provider. Document Revised: 05/02/2017 Document  Reviewed: 05/13/2016 Elsevier Patient Education  [employer].

## 2020-03-21 NOTE — Progress Notes (Addendum)
Patient ID: Karl Smith, male    DOB: 08-03-1995, 24 y.o.   MRN: [id number]   Chief Complaint  Patient presents with  . high bp readings    Patient comes in with concerns of high blood pressure after having a reading of 148/89 at home and recetn headaches. Family hx of hypertension. Reports trying to improve his diet in the last few days.    Subjective:  CC: concerned about blood pressure  Karl Smith is here today to have his blood pressure checked.  He noticed about a week ago that he was having a frontal headache.  No pain today.  He went to his grandmother's house and noted his blood pressure to be 148/89.  He reports a family history of hypertension.  Pertinent positives include cigarette smoker and no formal exercise program.  He notices that his headache is worse after a high sodium meal.  In the past week he has started to watch his sodium, increase water, but he has not started a formal exercise program as of yet.  Pertinent negatives include no fever, chills, chest pain, shortness of breath.  No headache today, blood pressure is good today.     Medical History Abdimalik has a past medical history of Allergy, Heart murmur, Herpes simplex type 1 infection, High blood pressure, and Pneumonia (18 months old).   Outpatient Encounter Medications as of 03/21/2020  Medication Sig  . diphenhydrAMINE (ALLERGY MEDICATION) 25 mg capsule Take 25 mg by mouth every 6 (six) hours as needed.  . [DISCONTINUED] amoxicillin (AMOXIL) 500 MG capsule Take 1 capsule (500 mg total) by mouth 2 (two) times daily.   No facility-administered encounter medications on file as of 03/21/2020.     Review of Systems  Respiratory: Negative for shortness of breath.   Cardiovascular: Negative for chest pain.  Gastrointestinal: Negative for abdominal pain.  Neurological: Positive for headaches.       Worse after high sodium meal  Psychiatric/Behavioral:       Has anxiety and smokes marijuana for this.       Vitals BP 128/78   Pulse 100   Temp (!) 97.1 F (36.2 C)   Wt 210 lb 3.2 oz (95.3 kg)   SpO2 100%   BMI 31.04 kg/m   Objective:   Physical Exam Vitals and nursing note reviewed.  Constitutional:      Appearance: Normal appearance.  Cardiovascular:     Rate and Rhythm: Normal rate and regular rhythm.     Heart sounds: Normal heart sounds.  Pulmonary:     Effort: Pulmonary effort is normal.     Breath sounds: Normal breath sounds.  Skin:    General: Skin is warm and dry.  Neurological:     Mental Status: He is alert and oriented to person, place, and time.  Psychiatric:        Mood and Affect: Mood normal.        Behavior: Behavior normal.        Thought Content: Thought content normal.        Judgment: Judgment normal.      Assessment and Plan   1. Hypertension, unspecified type   Much of today's visit was spent with lifestyle modification discussion.  Information was given today on the D ASH diet.  He is going to watch the sodium in his diet, increase water intake, and cut back on cigarette smoking.  He is instructed to limit alcohol use and to start a walking program working  up to 150 minutes/week.  He will monitor his blood pressure at least once a day, keep a blood pressure log, and bring this information at his next follow-up visit.  Encouraged him to sign up for my chart, where he can communicate with me if his blood pressures are not staying controlled.  Agrees with plan of care discussed today. Understands warning signs to seek further care: If blood pressures are not controlled, chest pain, shortness of breath, or any significant changes in his health status. Understands to follow-up in 3 months, sooner if these interventions are not effective.

## 2020-06-12 ENCOUNTER — Other Ambulatory Visit: Payer: Self-pay

## 2020-06-12 ENCOUNTER — Ambulatory Visit
Admission: EM | Admit: 2020-06-12 | Discharge: 2020-06-12 | Disposition: A | Discharge status: Home / Self Care | Payer: BC Managed Care – PPO | Attending: Emergency Medicine | Admitting: Emergency Medicine

## 2020-06-12 DIAGNOSIS — Z1152 Encounter for screening for COVID-19: Secondary | ICD-10-CM

## 2020-06-12 NOTE — ED Triage Notes (Signed)
Nurse visit only.  Symptoms started today.  Wants covid test

## 2020-06-14 LAB — SARS-COV-2, NAA 2 DAY TAT

## 2020-06-14 LAB — NOVEL CORONAVIRUS, NAA: SARS-CoV-2, NAA: DETECTED — AB

## 2020-06-16 ENCOUNTER — Other Ambulatory Visit: Payer: BC Managed Care – PPO

## 2020-06-21 ENCOUNTER — Encounter: Payer: BC Managed Care – PPO | Admitting: Family Medicine

## 2020-06-21 ENCOUNTER — Encounter: Payer: Self-pay | Admitting: Family Medicine

## 2020-08-23 ENCOUNTER — Ambulatory Visit (INDEPENDENT_AMBULATORY_CARE_PROVIDER_SITE_OTHER): Payer: BC Managed Care – PPO | Admitting: Family Medicine

## 2020-08-23 ENCOUNTER — Other Ambulatory Visit: Payer: Self-pay

## 2020-08-23 ENCOUNTER — Encounter: Payer: Self-pay | Admitting: Family Medicine

## 2020-08-23 VITALS — HR 88 | Temp 98.3°F | Resp 16 | Wt 195.6 lb

## 2020-08-23 DIAGNOSIS — J029 Acute pharyngitis, unspecified: Secondary | ICD-10-CM | POA: Diagnosis not present

## 2020-08-23 DIAGNOSIS — J020 Streptococcal pharyngitis: Secondary | ICD-10-CM | POA: Diagnosis not present

## 2020-08-23 LAB — POCT RAPID STREP A (OFFICE): Rapid Strep A Screen: POSITIVE — AB

## 2020-08-23 MED ORDER — PENICILLIN V POTASSIUM 500 MG PO TABS: 500.0000 mg | ORAL_TABLET | Freq: Three times a day (TID) | ORAL | 0 refills | Status: DC

## 2020-08-23 NOTE — Patient Instructions (Signed)
Try to stay hydrated. OTC medications for throat comfort. Salt gargles and honey may help.     Strep Throat, Adult Strep throat is an infection of the throat. It is caused by germs (bacteria). Strep throat is common during the cold months of the year. It mostly affects children who are 63-25 years old. However, people of all ages can get it at any time of the year. When strep throat affects the tonsils, it is called tonsillitis. When it affects the back of the throat, it is called pharyngitis. This infection spreads from person to person through coughing, sneezing, or having close contact. What are the causes? This condition is caused by the Streptococcus pyogenes germ. What increases the risk? You are more likely to develop this condition if:  You care for young children. Children are more likely to get strep throat and may spread it to others.  You go to crowded places. Germs can spread easily in such places.  You kiss or touch someone who has strep throat. What are the signs or symptoms? Symptoms of this condition include:  Fever or chills.  Redness, swelling, or pain in the tonsils or throat.  Pain or trouble when swallowing.  Laursen or yellow spots on the tonsils or throat.  Tender glands in the neck and under the jaw.  Bad breath.  Red rash all over the body. This is rare. How is this treated? This condition may be treated with:  Medicines that kill germs (antibiotics).  Medicines that treat pain or fever. These include: ? Ibuprofen or acetaminophen. ? Aspirin, only for patients who are over the age of 66. ? Throat lozenges. ? Throat sprays. Follow these instructions at home: Medicines  Take over-the-counter and prescription medicines only as told by your doctor.  Take your antibiotic medicine as told by your doctor. Do not stop taking the antibiotic even if you start to feel better.   Eating and drinking  If you have trouble swallowing, eat soft foods until  your throat feels better.  Drink enough fluid to keep your pee (urine) pale yellow.  To help with pain, you may have: ? Warm fluids, such as soup and tea. ? Cold fluids, such as frozen desserts or popsicles.   General instructions  Rinse your mouth (gargle) with a salt-water mixture 3-4 times a day or as needed. To make a salt-water mixture, dissolve -1 tsp (3-6 g) of salt in 1 cup (237 mL) of warm water.  Rest as much as you can.  Stay home from work or school until you have been taking antibiotics for 24 hours.  Avoid smoking or being around people who smoke.  Keep all follow-up visits as told by your doctor. This is important. How is this prevented?  Do not share food, drinking cups, or personal items. They can cause the germs to spread.  Wash your hands well with soap and water. Make sure that all people in your house wash their hands well.  Have family members tested if they have a fever or a sore throat. They may need an antibiotic if they have strep throat.   Contact a doctor if:  You have swelling in your neck that keeps getting bigger.  You get a rash, cough, or earache.  You cough up a thick fluid that is green, yellow-brown, or bloody.  You have pain that does not get better with medicine.  Your symptoms get worse instead of getting better.  You have a fever. Get help right  away if:  You vomit.  You have a very bad headache.  Your neck hurts or feels stiff.  You have chest pain or are short of breath.  You have drooling, very bad throat pain, or changes in your voice.  Your neck is swollen, or the skin gets red and tender.  Your mouth is dry, or you are peeing less than normal.  You keep feeling more tired or have trouble waking up.  Your joints are red or painful. Summary  Strep throat is an infection of the throat. It is caused by germs (bacteria).  This infection can spread from person to person through coughing, sneezing, or having close  contact.  Take your medicines, including antibiotics, as told by your doctor. Do not stop taking the antibiotic even if you start to feel better.  To prevent the spread of germs, wash your hands well with soap and water. Have others do the same. Do not share food, drinking cups, or personal items.  Get help right away if you have a bad headache, chest pain, shortness of breath, a stiff or painful neck, or you vomit. This information is not intended to replace advice given to you by your health care provider. Make sure you discuss any questions you have with your health care provider. Document Revised: 08/07/2018 Document Reviewed: 08/07/2018 Elsevier Patient Education  2021 [employer].

## 2020-08-23 NOTE — Progress Notes (Signed)
Patient ID: Karl Smith, male    DOB: 25-Oct-1995, 25 y.o.   MRN: [id number]   Chief Complaint  Patient presents with  . Sore Throat    Tonsil stones and coughing up brown/green mucus   Subjective:  CC: sore throat  Presents today for acute visit with c/o sore throat, tonsil stones, and productive cough. Recent Covid infection. Has history of strep throat. No fevers, positive chills, cough, congestion.  Has tried salt water gargles for his symptoms.  Has a history of bronchitis.  Drinking okay appetite is low not eating that much.    Medical History Love has a past medical history of Allergy, Heart murmur, Herpes simplex type 1 infection, High blood pressure, and Pneumonia (18 months old).   Outpatient Encounter Medications as of 08/23/2020  Medication Sig  . penicillin v potassium (VEETID) 500 MG tablet Take 1 tablet (500 mg total) by mouth 3 (three) times daily.  . diphenhydrAMINE (ALLERGY MEDICATION) 25 mg capsule Take 25 mg by mouth every 6 (six) hours as needed.   No facility-administered encounter medications on file as of 08/23/2020.     Review of Systems  Constitutional: Negative for chills and fever.  HENT: Positive for congestion and sore throat.   Respiratory: Positive for cough. Negative for shortness of breath.   Cardiovascular: Negative for chest pain.  Gastrointestinal: Negative for abdominal pain and nausea.  Neurological: Negative for headaches.     Vitals Pulse 88   Temp 98.3 F (36.8 C)   Resp 16   Wt 195 lb 9.6 oz (88.7 kg)   SpO2 99%   BMI 28.89 kg/m   Objective:   Physical Exam Vitals reviewed.  HENT:     Right Ear: Tympanic membrane normal.     Left Ear: Tympanic membrane normal.     Nose:     Right Turbinates: Swollen.     Left Turbinates: Swollen.     Right Sinus: No maxillary sinus tenderness or frontal sinus tenderness.     Left Sinus: No maxillary sinus tenderness or frontal sinus tenderness.     Mouth/Throat:     Mouth:  Mucous membranes are moist.     Pharynx: Uvula midline. Posterior oropharyngeal erythema present.     Tonsils: Tonsillar exudate present. 1+ on the right. 1+ on the left.  Cardiovascular:     Rate and Rhythm: Normal rate and regular rhythm.     Heart sounds: Normal heart sounds.  Pulmonary:     Effort: Pulmonary effort is normal.     Breath sounds: Normal breath sounds.  Skin:    General: Skin is warm and dry.  Neurological:     General: No focal deficit present.     Mental Status: He is alert.  Psychiatric:        Behavior: Behavior normal.     Results for orders placed or performed in visit on 08/23/20  POCT rapid strep A  Result Value Ref Range   Rapid Strep A Screen Positive (A) Negative     Assessment and Plan   1. Sore throat - POCT rapid strep A  2. Streptococcal sore throat - penicillin v potassium (VEETID) 500 MG tablet; Take 1 tablet (500 mg total) by mouth 3 (three) times daily.  Dispense: 30 tablet; Refill: 0   Rapid strep positive, will treat with penicillin for 10 days.  Encourage supportive care, adequate hydration and symptom relief.  Encouraged to use salt water gargles and honey for throat pain.  Agrees  with plan of care discussed today. Understands warning signs to seek further care: chest pain, shortness of breath, any significant change in health.  Understands to follow-up if symptoms do not improve, or worsen.  Dorena Bodo, NP 08/23/20

## 2020-09-01 IMAGING — CR DG CHEST 1V PORT
1 series · 1 of 1 positions shown · non-contrast
Comparison: August 16, 2014

CLINICAL DATA: Cough.

EXAM:
PORTABLE CHEST 1 VIEW

[portable]
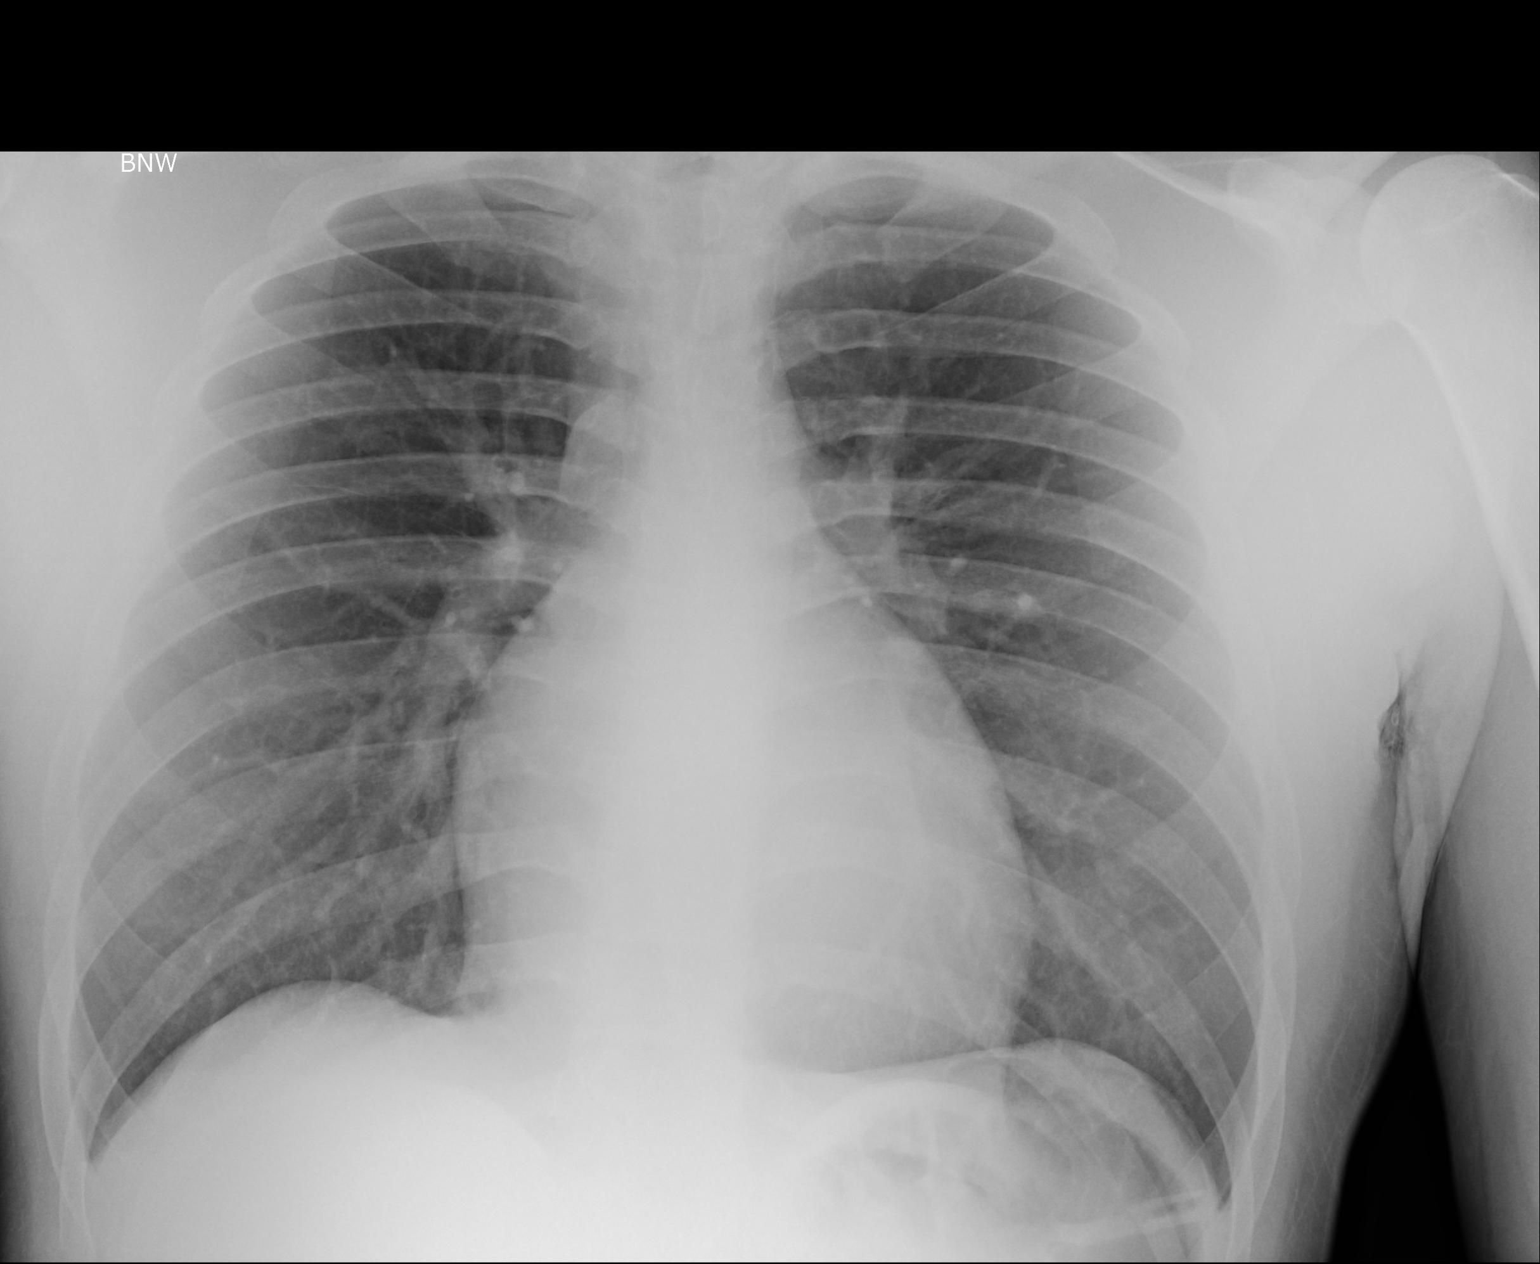

[1 of 1 positions shown; findings below may reference images not displayed]

FINDINGS: The heart size and mediastinal contours are within normal limits.
Both lungs are clear. The visualized skeletal structures are
unremarkable.
IMPRESSION: No active disease.

## 2021-08-09 ENCOUNTER — Other Ambulatory Visit: Payer: Self-pay

## 2021-08-09 ENCOUNTER — Ambulatory Visit
Admission: EM | Admit: 2021-08-09 | Discharge: 2021-08-09 | Disposition: A | Discharge status: Home / Self Care | Payer: 59 | Attending: Urgent Care | Admitting: Urgent Care

## 2021-08-09 DIAGNOSIS — M25632 Stiffness of left wrist, not elsewhere classified: Secondary | ICD-10-CM | POA: Diagnosis not present

## 2021-08-09 DIAGNOSIS — M25531 Pain in right wrist: Secondary | ICD-10-CM | POA: Diagnosis not present

## 2021-08-09 DIAGNOSIS — M79641 Pain in right hand: Secondary | ICD-10-CM

## 2021-08-09 DIAGNOSIS — M778 Other enthesopathies, not elsewhere classified: Secondary | ICD-10-CM | POA: Diagnosis not present

## 2021-08-09 DIAGNOSIS — M79642 Pain in left hand: Secondary | ICD-10-CM

## 2021-08-09 DIAGNOSIS — M25532 Pain in left wrist: Secondary | ICD-10-CM

## 2021-08-09 MED ORDER — NAPROXEN 500 MG PO TABS: 500.0000 mg | ORAL_TABLET | Freq: Two times a day (BID) | ORAL | 0 refills | Status: DC

## 2021-08-09 NOTE — ED Triage Notes (Signed)
Pt presents with c/o bilateral hand numbness and stiffness for past week , pt states he started new job 3 weeks ago and lifts heavy objects, needs work note  ?

## 2021-08-09 NOTE — ED Provider Notes (Signed)
?[facility] ? ? ?MRN: [id number] DOB: [birth year] ? ?Subjective:  ? ?Karl Smith is a 26 y.o. male presenting for 1 week history of progressively worsening bilateral wrist pain, hand pain, wrist stiffness.  Symptoms are worse over the left wrist.  He just started a job in the past 3 weeks and has been doing 40 hours a week during which he lifts many heavy items at a fast pace.  There was no injury in particular that happened while at work but his work is certainly made his symptoms worse.  Would like a note to rest today and tomorrow, through the weekend.  No history of musculoskeletal disorders. ? ?No current facility-administered medications for this encounter. ? ?Current Outpatient Medications:  ?  diphenhydrAMINE (ALLERGY MEDICATION) 25 mg capsule, Take 25 mg by mouth every 6 (six) hours as needed., Disp: , Rfl:  ?  penicillin v potassium (VEETID) 500 MG tablet, Take 1 tablet (500 mg total) by mouth 3 (three) times daily., Disp: 30 tablet, Rfl: 0  ? ?No Known Allergies ? ?Past Medical History:  ?Diagnosis Date  ? Allergy   ? seasonal  ? Heart murmur   ? Herpes simplex type 1 infection   ? High blood pressure   ? Pneumonia 72 months old  ?  ? ?Past Surgical History:  ?Procedure Laterality Date  ? HAND SURGERY    ? thumb surgery    ? thumb surgery Right 7th grade  ? ? ?Family History  ?Problem Relation Age of Onset  ? Depression Father   ? Bipolar disorder Cousin   ? ? ?Social History  ? ?Tobacco Use  ? Smoking status: Former  ?  Types: Cigarettes  ? Smokeless tobacco: Never  ?Vaping Use  ? Vaping Use: Never used  ?Substance Use Topics  ? Alcohol use: Yes  ?  Alcohol/week: 1.0 standard drink  ?  Types: 1 Cans of beer per week  ? Drug use: No  ? ? ?ROS ? ? ?Objective:  ? ?Vitals: ?BP (!) 142/72   Pulse 70   Temp 98.1 ?F (36.7 ?C) (Oral)   Resp 18   SpO2 98%  ? ?Physical Exam ?Constitutional:   ?   General: He is not in acute distress. ?   Appearance: Normal appearance. He is  well-developed and normal weight. He is not ill-appearing, toxic-appearing or diaphoretic.  ?HENT:  ?   Head: Normocephalic and atraumatic.  ?   Right Ear: External ear normal.  ?   Left Ear: External ear normal.  ?   Nose: Nose normal.  ?   Mouth/Throat:  ?   Pharynx: Oropharynx is clear.  ?Eyes:  ?   General: No scleral icterus.    ?   Right eye: No discharge.     ?   Left eye: No discharge.  ?   Extraocular Movements: Extraocular movements intact.  ?Cardiovascular:  ?   Rate and Rhythm: Normal rate.  ?Pulmonary:  ?   Effort: Pulmonary effort is normal.  ?Musculoskeletal:  ?   Right wrist: No swelling, deformity, effusion, lacerations, tenderness, bony tenderness, snuff box tenderness or crepitus. Normal range of motion.  ?   Left wrist: Tenderness present. No swelling, deformity, effusion, lacerations, bony tenderness, snuff box tenderness or crepitus. Normal range of motion.  ?   Right hand: No swelling, deformity, lacerations, tenderness or bony tenderness. Normal range of motion. Normal strength. Normal sensation. Normal capillary refill.  ?   Left hand: No swelling, deformity, lacerations,  tenderness or bony tenderness. Normal range of motion. Decreased strength (secondary to pain at the wrist). Normal sensation. Normal capillary refill.  ?   Cervical back: Normal range of motion.  ?   Comments: Negative Phalen's and Tinel's test.  ?Neurological:  ?   Mental Status: He is alert and oriented to person, place, and time.  ?Psychiatric:     ?   Mood and Affect: Mood normal.     ?   Behavior: Behavior normal.     ?   Thought Content: Thought content normal.     ?   Judgment: Judgment normal.  ? ? ?Assessment and Plan :  ? ?PDMP not reviewed this encounter. ? ?1. Tendonitis of both wrists   ?2. Bilateral wrist pain   ?3. Pain in both hands   ?4. Stiffness of left wrist joint   ? ?Suspect an inflammatory tendinitis secondary to the strenuous nature of his job.  Recommended rice method and naproxen for pain and  inflammation.  Provided him with a note for work. Counseled patient on potential for adverse effects with medications prescribed/recommended today, ER and return-to-clinic precautions discussed, patient verbalized understanding. ? ?  ?Wallis Bamberg, PA-C ?08/09/21 1019 ? ?

## 2021-11-28 ENCOUNTER — Ambulatory Visit
Admission: EM | Admit: 2021-11-28 | Discharge: 2021-11-28 | Disposition: A | Discharge status: Home / Self Care | Payer: 59 | Attending: Nurse Practitioner | Admitting: Nurse Practitioner

## 2021-11-28 ENCOUNTER — Encounter: Payer: Self-pay | Admitting: Emergency Medicine

## 2021-11-28 ENCOUNTER — Other Ambulatory Visit: Payer: Self-pay

## 2021-11-28 DIAGNOSIS — R051 Acute cough: Secondary | ICD-10-CM

## 2021-11-28 DIAGNOSIS — R0989 Other specified symptoms and signs involving the circulatory and respiratory systems: Secondary | ICD-10-CM | POA: Diagnosis not present

## 2021-11-28 DIAGNOSIS — J069 Acute upper respiratory infection, unspecified: Secondary | ICD-10-CM

## 2021-11-28 MED ORDER — PREDNISONE 20 MG PO TABS: 40.0000 mg | ORAL_TABLET | Freq: Every day | ORAL | 0 refills | Status: AC

## 2021-11-28 MED ORDER — ALBUTEROL SULFATE HFA 108 (90 BASE) MCG/ACT IN AERS: 2.0000 | INHALATION_SPRAY | Freq: Four times a day (QID) | RESPIRATORY_TRACT | 1 refills | Status: AC | PRN

## 2021-11-28 MED ORDER — AZITHROMYCIN 250 MG PO TABS: 250.0000 mg | ORAL_TABLET | Freq: Every day | ORAL | 0 refills | Status: DC

## 2021-11-28 MED ORDER — ALBUTEROL SULFATE (2.5 MG/3ML) 0.083% IN NEBU
2.5000 mg | INHALATION_SOLUTION | RESPIRATORY_TRACT | Status: AC
  Administered 2021-11-28: 2.5 mg via RESPIRATORY_TRACT

## 2021-11-28 MED ORDER — PSEUDOEPH-BROMPHEN-DM 30-2-10 MG/5ML PO SYRP: 5.0000 mL | ORAL_SOLUTION | Freq: Four times a day (QID) | ORAL | 0 refills | Status: DC | PRN

## 2021-11-28 NOTE — Discharge Instructions (Addendum)
COVID/flu test is pending.  Also positive, you will be contacted. Take medication as prescribed. May take over-the-counter ibuprofen or Tylenol for pain, fever, or general discomfort. Increase fluids and allow for plenty of rest. Recommend using an albuterol inhaler to help with cough and nasal congestion. Sleep elevated on 2 pillows to help with cough while symptoms persist. As discussed, please do not smoke while your symptoms persist, as this may worsen your symptoms or delay recovery. Go to the emergency department if you develop worsening chest tightness, shortness of breath, inability to speak in a complete sentence or other concerns.

## 2021-11-28 NOTE — ED Triage Notes (Signed)
Pt reports cough, runny nose, nasal and chest congestion since Monday night. Pt denies any known fevers and reports minimal change in symptoms with otc medication. Pt reports inhalers have helped at home.

## 2021-11-28 NOTE — ED Provider Notes (Signed)
[facility]    CSN: [id number] Arrival date & time: 11/28/21  0820      History   Chief Complaint Chief Complaint  Patient presents with   Cough    HPI Karl Smith is a 26 y.o. male.   The history is provided by the patient.   Patient presents with a 2-day history of cough, runny nose, nasal congestion, and chest congestion.  Cough has been productive of yellow to brownish sputum per the patient's report.  He also complains of chest tightness, wheezing, and shortness of breath.  Patient denies fever, chills, headache, ear pain, difficulty breathing, or GI symptoms.  Patient states he has a history of bronchitis.  He has been using an old inhaler that he has at home.  States this is helped his symptoms.  Also endorses a history of allergies.  Has been taking over-the-counter allergy medication.  Patient reports history of smoking, but states he has not been smoking since the symptoms started.  Past Medical History:  Diagnosis Date   Allergy    seasonal   Heart murmur    Herpes simplex type 1 infection    High blood pressure    Pneumonia 71 months old    Patient Active Problem List   Diagnosis Date Noted   Sore throat 08/23/2020   Streptococcal sore throat 08/23/2020   High blood pressure 11/23/2012   CLOSED FRACTURE OF BASE OF THUMB METACARPAL BONE 02/01/2008    Past Surgical History:  Procedure Laterality Date   HAND SURGERY     thumb surgery     thumb surgery Right 7th grade       Home Medications    Prior to Admission medications   Medication Sig Start Date End Date Taking? Authorizing Provider  albuterol (VENTOLIN HFA) 108 (90 Base) MCG/ACT inhaler Inhale 2 puffs into the lungs every 6 (six) hours as needed for wheezing or shortness of breath. 11/28/21  Yes Estalee Mccandlish-Warren, Sadie Haber, NP  azithromycin (ZITHROMAX) 250 MG tablet Take 1 tablet (250 mg total) by mouth daily. Take first 2 tablets together, then 1 every day until finished. 11/28/21   Yes Darell Saputo-Warren, Sadie Haber, NP  brompheniramine-pseudoephedrine-DM 30-2-10 MG/5ML syrup Take 5 mLs by mouth 4 (four) times daily as needed. 11/28/21  Yes Odesser Tourangeau-Warren, Sadie Haber, NP  predniSONE (DELTASONE) 20 MG tablet Take 2 tablets (40 mg total) by mouth daily with breakfast for 5 days. 11/28/21 12/03/21 Yes Emma-Lee Oddo-Warren, Sadie Haber, NP  diphenhydrAMINE (ALLERGY MEDICATION) 25 mg capsule Take 25 mg by mouth every 6 (six) hours as needed.    [provider]  naproxen (NAPROSYN) 500 MG tablet Take 1 tablet (500 mg total) by mouth 2 (two) times daily with a meal. 08/09/21   Wallis Bamberg, PA-C  penicillin v potassium (VEETID) 500 MG tablet Take 1 tablet (500 mg total) by mouth 3 (three) times daily. 08/23/20   Novella Olive, NP    Family History Family History  Problem Relation Age of Onset   Depression Father    Bipolar disorder Cousin     Social History Social History   Tobacco Use   Smoking status: Former    Types: Cigarettes   Smokeless tobacco: Never  Vaping Use   Vaping Use: Never used  Substance Use Topics   Alcohol use: Yes    Alcohol/week: 1.0 standard drink of alcohol    Types: 1 Cans of beer per week   Drug use: No     Allergies  Patient has no known allergies.   Review of Systems Review of Systems Per HPI  Physical Exam Triage Vital Signs ED Triage Vitals [11/28/21 0840]  Enc Vitals Group     BP (!) 147/92     Pulse Rate 61     Resp 18     Temp 98.9 F (37.2 C)     Temp Source Oral     SpO2 98 %     Weight      Height      Head Circumference      Peak Flow      Pain Score 5     Pain Loc      Pain Edu?      Excl. in GC?    No data found.  Updated Vital Signs BP (!) 147/92 (BP Location: Right Arm)   Pulse 61   Temp 98.9 F (37.2 C) (Oral)   Resp 18   SpO2 98%   Visual Acuity Right Eye Distance:   Left Eye Distance:   Bilateral Distance:    Right Eye Near:   Left Eye Near:    Bilateral Near:     Physical Exam Constitutional:       General: He is not in acute distress.    Appearance: Normal appearance. He is well-developed.  HENT:     Head: Normocephalic and atraumatic.     Right Ear: Tympanic membrane, ear canal and external ear normal.     Left Ear: Tympanic membrane, ear canal and external ear normal.     Nose: Congestion present.     Right Turbinates: Enlarged and swollen.     Left Turbinates: Enlarged and swollen.     Right Sinus: No maxillary sinus tenderness or frontal sinus tenderness.     Left Sinus: No maxillary sinus tenderness or frontal sinus tenderness.     Mouth/Throat:     Mouth: Mucous membranes are moist.     Pharynx: Posterior oropharyngeal erythema present. No oropharyngeal exudate.     Comments: + PND Eyes:     Conjunctiva/sclera: Conjunctivae normal.     Pupils: Pupils are equal, round, and reactive to light.  Neck:     Thyroid: No thyromegaly.     Trachea: No tracheal deviation.  Cardiovascular:     Rate and Rhythm: Normal rate and regular rhythm.     Pulses: Normal pulses.     Heart sounds: Normal heart sounds.  Pulmonary:     Effort: Pulmonary effort is normal. No respiratory distress.     Breath sounds: Wheezing (wheezing noted in all lung fields) present. No rales.     Comments: Post nebulizer, lungs CTA. Chest:     Chest wall: No tenderness.  Abdominal:     General: Bowel sounds are normal. There is no distension.     Palpations: Abdomen is soft.     Tenderness: There is no abdominal tenderness.  Musculoskeletal:     Cervical back: Normal range of motion and neck supple.  Skin:    General: Skin is warm and dry.  Neurological:     General: No focal deficit present.     Mental Status: He is alert and oriented to person, place, and time.  Psychiatric:        Mood and Affect: Mood normal.        Behavior: Behavior normal.        Thought Content: Thought content normal.        Judgment: Judgment normal.  UC Treatments / Results  Labs (all labs ordered are  listed, but only abnormal results are displayed) Labs Reviewed  COVID-19, FLU A+B NAA    EKG   Radiology No results found.  Procedures Procedures (including critical care time)  Medications Ordered in UC Medications  albuterol (PROVENTIL) (2.5 MG/3ML) 0.083% nebulizer solution 2.5 mg (2.5 mg Nebulization Given 11/28/21 0929)    Initial Impression / Assessment and Plan / UC Course  I have reviewed the triage vital signs and the nursing notes.  Pertinent labs & imaging results that were available during my care of the patient were reviewed by me and considered in my medical decision making (see chart for details).  Patient presents with upper respiratory symptoms that been present for the past 2 days.  His vital signs are stable.  On exam, patient has wheezing noted in all lung fields.  He is in no acute distress.  Symptoms are consistent with upper respiratory infection.  We will treat patient prophylactically with azithromycin, Bromfed, and an albuterol inhaler to help with his wheezing.  Supportive care recommendations were provided to the patient.  Strict return precautions were also discussed with the patient of when to return to this clinic versus going to the ER.  Follow-up as needed. Final Clinical Impressions(s) / UC Diagnoses   Final diagnoses:  Symptoms of upper respiratory infection (URI)  Acute cough  Acute upper respiratory infection     Discharge Instructions      COVID/flu test is pending.  Also positive, you will be contacted. Take medication as prescribed. May take over-the-counter ibuprofen or Tylenol for pain, fever, or general discomfort. Increase fluids and allow for plenty of rest. Recommend using an albuterol inhaler to help with cough and nasal congestion. Sleep elevated on 2 pillows to help with cough while symptoms persist. As discussed, please do not smoke while your symptoms persist, as this may worsen your symptoms or delay recovery. Go to the  emergency department if you develop worsening chest tightness, shortness of breath, inability to speak in a complete sentence or other concerns.       ED Prescriptions     Medication Sig Dispense Auth. Provider   azithromycin (ZITHROMAX) 250 MG tablet Take 1 tablet (250 mg total) by mouth daily. Take first 2 tablets together, then 1 every day until finished. 6 tablet Osten Janek-Warren, Sadie Haber, NP   brompheniramine-pseudoephedrine-DM 30-2-10 MG/5ML syrup Take 5 mLs by mouth 4 (four) times daily as needed. 140 mL Kiing Tarpley-Warren, Sadie Haber, NP   albuterol (VENTOLIN HFA) 108 (90 Base) MCG/ACT inhaler Inhale 2 puffs into the lungs every 6 (six) hours as needed for wheezing or shortness of breath. 8 g Hever Castilleja-Warren, Sadie Haber, NP   predniSONE (DELTASONE) 20 MG tablet Take 2 tablets (40 mg total) by mouth daily with breakfast for 5 days. 10 tablet Brithney Bensen-Warren, Sadie Haber, NP      PDMP not reviewed this encounter.   Abran Cantor, NP 11/28/21 [phone]

## 2021-11-29 LAB — COVID-19, FLU A+B NAA
Influenza A, NAA: NOT DETECTED
Influenza B, NAA: NOT DETECTED
SARS-CoV-2, NAA: NOT DETECTED

## 2021-12-12 ENCOUNTER — Telehealth: Payer: Self-pay | Admitting: *Deleted

## 2021-12-12 NOTE — Telephone Encounter (Signed)
Patient called back and stated his cell signal was poor when he called the first time and he was needing to know the date of last Tdap. Patient given information requested.

## 2021-12-12 NOTE — Telephone Encounter (Addendum)
Patient called to ask question- cell dropped call - unable to reach patient back on number and patient did not call back

## 2022-04-11 ENCOUNTER — Encounter (HOSPITAL_BASED_OUTPATIENT_CLINIC_OR_DEPARTMENT_OTHER): Payer: Self-pay | Admitting: *Deleted

## 2022-07-17 ENCOUNTER — Ambulatory Visit
Admission: EM | Admit: 2022-07-17 | Discharge: 2022-07-17 | Disposition: A | Discharge status: Home / Self Care | Payer: 59 | Attending: Family Medicine | Admitting: Family Medicine

## 2022-07-17 ENCOUNTER — Encounter: Payer: Self-pay | Admitting: Emergency Medicine

## 2022-07-17 DIAGNOSIS — Z1152 Encounter for screening for COVID-19: Secondary | ICD-10-CM | POA: Insufficient documentation

## 2022-07-17 DIAGNOSIS — R059 Cough, unspecified: Secondary | ICD-10-CM | POA: Diagnosis not present

## 2022-07-17 DIAGNOSIS — J069 Acute upper respiratory infection, unspecified: Secondary | ICD-10-CM | POA: Diagnosis present

## 2022-07-17 MED ORDER — FLUTICASONE PROPIONATE 50 MCG/ACT NA SUSP: 1.0000 | Freq: Two times a day (BID) | NASAL | 2 refills | Status: DC

## 2022-07-17 MED ORDER — PROMETHAZINE-DM 6.25-15 MG/5ML PO SYRP: 5.0000 mL | ORAL_SOLUTION | Freq: Four times a day (QID) | ORAL | 0 refills | Status: DC | PRN

## 2022-07-17 NOTE — ED Provider Notes (Signed)
[facility]    CSN: [id number] Arrival date & time: 07/17/22  [id number]      History   Chief Complaint No chief complaint on file.   HPI Karl Smith is a 27 y.o. male.   Patient presenting today with 4-day history of nasal congestion, sore throat, cough.  Denies fever, chills, chest pain, shortness of breath, abdominal pain, nausea vomiting or diarrhea.  So far taking Mucinex and antihistamines with minimal relief.  Daughter sick with similar symptoms.  Multiple sick contacts at work recently.    Past Medical History:  Diagnosis Date   Allergy    seasonal   Heart murmur    Herpes simplex type 1 infection    High blood pressure    Pneumonia 63 months old    Patient Active Problem List   Diagnosis Date Noted   Sore throat 08/23/2020   Streptococcal sore throat 08/23/2020   High blood pressure 11/23/2012   CLOSED FRACTURE OF BASE OF THUMB METACARPAL BONE 02/01/2008    Past Surgical History:  Procedure Laterality Date   HAND SURGERY     thumb surgery     thumb surgery Right 7th grade       Home Medications    Prior to Admission medications   Medication Sig Start Date End Date Taking? Authorizing Provider  fluticasone (FLONASE) 50 MCG/ACT nasal spray Place 1 spray into both nostrils 2 (two) times daily. 07/17/22  Yes Volney American, PA-C  promethazine-dextromethorphan (PROMETHAZINE-DM) 6.25-15 MG/5ML syrup Take 5 mLs by mouth 4 (four) times daily as needed. 07/17/22  Yes Volney American, PA-C  albuterol (VENTOLIN HFA) 108 (90 Base) MCG/ACT inhaler Inhale 2 puffs into the lungs every 6 (six) hours as needed for wheezing or shortness of breath. 11/28/21   Leath-Warren, Alda Lea, NP  azithromycin (ZITHROMAX) 250 MG tablet Take 1 tablet (250 mg total) by mouth daily. Take first 2 tablets together, then 1 every day until finished. 11/28/21   Leath-Warren, Alda Lea, NP  brompheniramine-pseudoephedrine-DM 30-2-10 MG/5ML syrup Take 5 mLs by mouth  4 (four) times daily as needed. 11/28/21   Leath-Warren, Alda Lea, NP  diphenhydrAMINE (ALLERGY MEDICATION) 25 mg capsule Take 25 mg by mouth every 6 (six) hours as needed.    [provider]  naproxen (NAPROSYN) 500 MG tablet Take 1 tablet (500 mg total) by mouth 2 (two) times daily with a meal. 08/09/21   Jaynee Eagles, PA-C  penicillin v potassium (VEETID) 500 MG tablet Take 1 tablet (500 mg total) by mouth 3 (three) times daily. 08/23/20   Chalmers Guest, FNP    Family History Family History  Problem Relation Age of Onset   Depression Father    Bipolar disorder Cousin     Social History Social History   Tobacco Use   Smoking status: Former    Types: Cigarettes   Smokeless tobacco: Never  Vaping Use   Vaping Use: Never used  Substance Use Topics   Alcohol use: Yes    Alcohol/week: 1.0 standard drink of alcohol    Types: 1 Cans of beer per week   Drug use: No     Allergies   Patient has no known allergies.   Review of Systems Review of Systems Per HPI  Physical Exam Triage Vital Signs ED Triage Vitals  Enc Vitals Group     BP 07/17/22 0831 (!) 172/70     Pulse Rate 07/17/22 0831 (!) 54     Resp 07/17/22 0831 18  Temp 07/17/22 0831 98.3 F (36.8 C)     Temp Source 07/17/22 0831 Oral     SpO2 07/17/22 0831 100 %     Weight --      Height --      Head Circumference --      Peak Flow --      Pain Score 07/17/22 0832 4     Pain Loc --      Pain Edu? --      Excl. in [place]? --    No data found.  Updated Vital Signs BP (!) 172/70 (BP Location: Right Arm)   Pulse (!) 54   Temp 98.3 F (36.8 C) (Oral)   Resp 18   SpO2 100%   Visual Acuity Right Eye Distance:   Left Eye Distance:   Bilateral Distance:    Right Eye Near:   Left Eye Near:    Bilateral Near:     Physical Exam Vitals and nursing note reviewed.  Constitutional:      Appearance: He is well-developed.  HENT:     Head: Atraumatic.     Right Ear: External ear normal.     Left  Ear: External ear normal.     Nose: Rhinorrhea present.     Mouth/Throat:     Pharynx: Posterior oropharyngeal erythema present. No oropharyngeal exudate.  Eyes:     Conjunctiva/sclera: Conjunctivae normal.     Pupils: Pupils are equal, round, and reactive to light.  Cardiovascular:     Rate and Rhythm: Normal rate and regular rhythm.  Pulmonary:     Effort: Pulmonary effort is normal. No respiratory distress.     Breath sounds: No wheezing or rales.  Musculoskeletal:        General: Normal range of motion.     Cervical back: Normal range of motion and neck supple.  Lymphadenopathy:     Cervical: No cervical adenopathy.  Skin:    General: Skin is warm and dry.  Neurological:     Mental Status: He is alert and oriented to person, place, and time.  Psychiatric:        Behavior: Behavior normal.      UC Treatments / Results  Labs (all labs ordered are listed, but only abnormal results are displayed) Labs Reviewed  SARS CORONAVIRUS 2 (TAT 6-24 HRS)    EKG   Radiology No results found.  Procedures Procedures (including critical care time)  Medications Ordered in UC Medications - No data to display  Initial Impression / Assessment and Plan / UC Course  I have reviewed the triage vital signs and the nursing notes.  Pertinent labs & imaging results that were available during my care of the patient were reviewed by me and considered in my medical decision making (see chart for details).     Vitals and exam reassuring and suggestive of a viral upper respiratory infection.  COVID testing pending, treat with Phenergan DM, Flonase, supportive over-the-counter medications and home care.  Work note given.  Return for worsening symptoms.  Final Clinical Impressions(s) / UC Diagnoses   Final diagnoses:  Viral URI with cough   Discharge Instructions   None    ED Prescriptions     Medication Sig Dispense Auth. Provider   promethazine-dextromethorphan (PROMETHAZINE-DM)  6.25-15 MG/5ML syrup Take 5 mLs by mouth 4 (four) times daily as needed. 100 mL Volney American, PA-C   fluticasone [facility]) 50 MCG/ACT nasal spray Place 1 spray into both nostrils 2 (two) times daily.  16 g Volney American, [place]      PDMP not reviewed this encounter.   Volney American, [place] 07/17/22 1058

## 2022-07-17 NOTE — ED Triage Notes (Signed)
Sore throat since Sunday.  Nasal congestion and drainage. Productive cough.

## 2022-07-18 LAB — SARS CORONAVIRUS 2 (TAT 6-24 HRS): SARS Coronavirus 2: NEGATIVE

## 2023-01-08 NOTE — Progress Notes (Signed)
This encounter was created in error - please disregard.

## 2023-04-04 ENCOUNTER — Ambulatory Visit
Admission: EM | Admit: 2023-04-04 | Discharge: 2023-04-04 | Disposition: A | Discharge status: Home / Self Care | Payer: BC Managed Care – PPO | Attending: Family Medicine | Admitting: Family Medicine

## 2023-04-04 ENCOUNTER — Encounter: Payer: Self-pay | Admitting: Emergency Medicine

## 2023-04-04 DIAGNOSIS — J029 Acute pharyngitis, unspecified: Secondary | ICD-10-CM | POA: Insufficient documentation

## 2023-04-04 DIAGNOSIS — J3089 Other allergic rhinitis: Secondary | ICD-10-CM | POA: Insufficient documentation

## 2023-04-04 LAB — POCT RAPID STREP A (OFFICE): Rapid Strep A Screen: NEGATIVE

## 2023-04-04 MED ORDER — CETIRIZINE HCL 10 MG PO TABS: 10.0000 mg | ORAL_TABLET | Freq: Every day | ORAL | 2 refills | Status: DC

## 2023-04-04 MED ORDER — LIDOCAINE VISCOUS HCL 2 % MT SOLN: 10.0000 mL | OROMUCOSAL | 0 refills | Status: DC | PRN

## 2023-04-04 MED ORDER — PREDNISONE 20 MG PO TABS: 40.0000 mg | ORAL_TABLET | Freq: Every day | ORAL | 0 refills | Status: DC

## 2023-04-04 MED ORDER — FLUTICASONE PROPIONATE 50 MCG/ACT NA SUSP: 1.0000 | Freq: Two times a day (BID) | NASAL | 2 refills | Status: AC

## 2023-04-04 NOTE — ED Triage Notes (Signed)
Nasal congestion and right ear pain that hurts worse than the left ear.  States neck feels swollen and throat is sore.

## 2023-04-04 NOTE — ED Provider Notes (Signed)
[facility]    CSN: [id number] Arrival date & time: 04/04/23  [id number]      History   Chief Complaint No chief complaint on file.   HPI Karl Smith is a 27 y.o. male.   Patient presenting today with about a week of nasal congestion, bilateral ear pain worse on the right, sore throat, neck soreness below the right ear.  Denies fever, chills, chest pain, shortness of breath, abdominal pain, nausea vomiting or diarrhea.  So far trying Afrin, Mucinex, Robitussin with minimal relief.  History of seasonal allergies not currently on anything for this.  No new sick contacts recently.    Past Medical History:  Diagnosis Date   Allergy    seasonal   Heart murmur    Herpes simplex type 1 infection    High blood pressure    Pneumonia 2 months old    Patient Active Problem List   Diagnosis Date Noted   Sore throat 08/23/2020   Streptococcal sore throat 08/23/2020   High blood pressure 11/23/2012   Closed fracture of base of first metacarpal bone 02/01/2008    Past Surgical History:  Procedure Laterality Date   HAND SURGERY     thumb surgery     thumb surgery Right 7th grade       Home Medications    Prior to Admission medications   Medication Sig Start Date End Date Taking? Authorizing Provider  cetirizine (ZYRTEC ALLERGY) 10 MG tablet Take 1 tablet (10 mg total) by mouth daily. 04/04/23  Yes Particia Nearing, PA-C  fluticasone [facility]) 50 MCG/ACT nasal spray Place 1 spray into both nostrils 2 (two) times daily. 04/04/23  Yes Particia Nearing, PA-C  lidocaine (XYLOCAINE) 2 % solution Use as directed 10 mLs in the mouth or throat every 3 (three) hours as needed. 04/04/23  Yes Particia Nearing, PA-C  predniSONE (DELTASONE) 20 MG tablet Take 2 tablets (40 mg total) by mouth daily with breakfast. 04/04/23  Yes Particia Nearing, PA-C  albuterol (VENTOLIN HFA) 108 (90 Base) MCG/ACT inhaler Inhale 2 puffs into the lungs every 6 (six) hours as  needed for wheezing or shortness of breath. 11/28/21   Leath-Warren, Sadie Haber, NP  diphenhydrAMINE (ALLERGY MEDICATION) 25 mg capsule Take 25 mg by mouth every 6 (six) hours as needed.    [provider]    Family History Family History  Problem Relation Age of Onset   Depression Father    Bipolar disorder Cousin     Social History Social History   Tobacco Use   Smoking status: Former    Types: Cigarettes   Smokeless tobacco: Never  Vaping Use   Vaping status: Never Used  Substance Use Topics   Alcohol use: Yes    Alcohol/week: 1.0 standard drink of alcohol    Types: 1 Cans of beer per week   Drug use: No     Allergies   Patient has no known allergies.   Review of Systems Review of Systems Per HPI  Physical Exam Triage Vital Signs ED Triage Vitals  Encounter Vitals Group     BP 04/04/23 0829 (!) 158/77     Systolic BP Percentile --      Diastolic BP Percentile --      Pulse Rate 04/04/23 0829 94     Resp 04/04/23 0829 16     Temp 04/04/23 0829 98.5 F (36.9 C)     Temp Source 04/04/23 0829 Oral  SpO2 04/04/23 0829 98 %     Weight --      Height --      Head Circumference --      Peak Flow --      Pain Score 04/04/23 0831 7     Pain Loc --      Pain Education --      Exclude from Growth Chart --    No data found.  Updated Vital Signs BP (!) 158/77 (BP Location: Right Arm)   Pulse 94   Temp 98.5 F (36.9 C) (Oral)   Resp 16   SpO2 98%   Visual Acuity Right Eye Distance:   Left Eye Distance:   Bilateral Distance:    Right Eye Near:   Left Eye Near:    Bilateral Near:     Physical Exam Vitals and nursing note reviewed.  Constitutional:      Appearance: He is well-developed.  HENT:     Head: Atraumatic.     Right Ear: External ear normal.     Left Ear: External ear normal.     Ears:     Comments: Middle ear effusions bilaterally    Nose: Rhinorrhea present.     Mouth/Throat:     Mouth: Mucous membranes are moist.      Pharynx: Oropharynx is clear. Posterior oropharyngeal erythema present. No oropharyngeal exudate.  Eyes:     Conjunctiva/sclera: Conjunctivae normal.     Pupils: Pupils are equal, round, and reactive to light.  Cardiovascular:     Rate and Rhythm: Normal rate and regular rhythm.  Pulmonary:     Effort: Pulmonary effort is normal. No respiratory distress.     Breath sounds: No wheezing or rales.  Musculoskeletal:        General: Normal range of motion.     Cervical back: Normal range of motion and neck supple.  Lymphadenopathy:     Cervical: No cervical adenopathy.  Skin:    General: Skin is warm and dry.  Neurological:     Mental Status: He is alert and oriented to person, place, and time.  Psychiatric:        Behavior: Behavior normal.      UC Treatments / Results  Labs (all labs ordered are listed, but only abnormal results are displayed) Labs Reviewed  CULTURE, GROUP A STREP [facility])  POCT RAPID STREP A (OFFICE)    EKG   Radiology No results found.  Procedures Procedures (including critical care time)  Medications Ordered in UC Medications - No data to display  Initial Impression / Assessment and Plan / UC Course  I have reviewed the triage vital signs and the nursing notes.  Pertinent labs & imaging results that were available during my care of the patient were reviewed by me and considered in my medical decision making (see chart for details).     Mildly hypertensive in triage, otherwise vital signs reassuring.  He is well-appearing and in no acute distress.  Suspect seasonal allergy exacerbation, rapid strep negative, throat culture pending.  Treat with short course of prednisone, start good allergy regimen daily with Zyrtec and Flonase and discussed viscous lidocaine and supportive over-the-counter medications and home care.  Work note given.  Return for worsening symptoms.  Final Clinical Impressions(s) / UC Diagnoses   Final diagnoses:  Acute  pharyngitis, unspecified etiology  Seasonal allergic rhinitis due to other allergic trigger   Discharge Instructions   None    ED Prescriptions  Medication Sig Dispense Auth. Provider   predniSONE (DELTASONE) 20 MG tablet Take 2 tablets (40 mg total) by mouth daily with breakfast. 10 tablet Particia Nearing, PA-C   lidocaine (XYLOCAINE) 2 % solution Use as directed 10 mLs in the mouth or throat every 3 (three) hours as needed. 100 mL Particia Nearing, PA-C   fluticasone [facility]) 50 MCG/ACT nasal spray Place 1 spray into both nostrils 2 (two) times daily. 16 g Particia Nearing, [place]   cetirizine (ZYRTEC ALLERGY) 10 MG tablet Take 1 tablet (10 mg total) by mouth daily. 30 tablet Particia Nearing, [place]      PDMP not reviewed this encounter.   Roosvelt Maser [place], [place] 04/04/23 [phone]

## 2023-04-07 LAB — CULTURE, GROUP A STREP (THRC)

## 2023-09-01 ENCOUNTER — Ambulatory Visit: Payer: BC Managed Care – PPO | Admitting: Physician Assistant

## 2023-09-17 ENCOUNTER — Ambulatory Visit (INDEPENDENT_AMBULATORY_CARE_PROVIDER_SITE_OTHER): Admitting: Physician Assistant

## 2023-09-17 ENCOUNTER — Encounter: Payer: Self-pay | Admitting: Physician Assistant

## 2023-09-17 VITALS — BP 128/77 | HR 90 | Temp 98.5°F | Ht 69.0 in | Wt 192.0 lb

## 2023-09-17 DIAGNOSIS — Z833 Family history of diabetes mellitus: Secondary | ICD-10-CM

## 2023-09-17 DIAGNOSIS — J301 Allergic rhinitis due to pollen: Secondary | ICD-10-CM

## 2023-09-17 DIAGNOSIS — J309 Allergic rhinitis, unspecified: Secondary | ICD-10-CM | POA: Insufficient documentation

## 2023-09-17 DIAGNOSIS — Z7689 Persons encountering health services in other specified circumstances: Secondary | ICD-10-CM

## 2023-09-17 MED ORDER — CETIRIZINE HCL 10 MG PO TABS: 10.0000 mg | ORAL_TABLET | Freq: Every day | ORAL | 2 refills | Status: AC

## 2023-09-17 NOTE — Assessment & Plan Note (Signed)
 Patient presents today with long history of seasonal allergies. He reports worsening in the last few weeks. Zyrtec refilled today. Advised continued use of Flonase for congestion and drainage. He can use saline spray as needed for dry nose.

## 2023-09-17 NOTE — Progress Notes (Signed)
 New Patient Office Visit  Subjective    Patient ID: Karl Smith, male    DOB: [birth year]  Age: 28 y.o. MRN: [id number]  CC:  Chief Complaint  Patient presents with   New Patient (Initial Visit)    HPI Karl Smith presents to establish care  Patient presents today with no significant past medical history. He reports worsening seasonal allergies and requests refill of Zyrtec today. Patient reports family history of diabetes in his dad and requests lab work today. He denies polydipsia or polyuria. Patient with no other concerns of complaints today.   Outpatient Encounter Medications as of 09/17/2023  Medication Sig   albuterol (VENTOLIN HFA) 108 (90 Base) MCG/ACT inhaler Inhale 2 puffs into the lungs every 6 (six) hours as needed for wheezing or shortness of breath.   fluticasone (FLONASE) 50 MCG/ACT nasal spray Place 1 spray into both nostrils 2 (two) times daily.   [DISCONTINUED] cetirizine (ZYRTEC ALLERGY) 10 MG tablet Take 1 tablet (10 mg total) by mouth daily.   cetirizine (ZYRTEC ALLERGY) 10 MG tablet Take 1 tablet (10 mg total) by mouth daily.   [DISCONTINUED] diphenhydrAMINE (ALLERGY MEDICATION) 25 mg capsule Take 25 mg by mouth every 6 (six) hours as needed.   [DISCONTINUED] lidocaine (XYLOCAINE) 2 % solution Use as directed 10 mLs in the mouth or throat every 3 (three) hours as needed.   [DISCONTINUED] predniSONE (DELTASONE) 20 MG tablet Take 2 tablets (40 mg total) by mouth daily with breakfast.   No facility-administered encounter medications on file as of 09/17/2023.    Past Medical History:  Diagnosis Date   Allergy    seasonal   Heart murmur    Herpes simplex type 1 infection    High blood pressure    Pneumonia 38 months old    Past Surgical History:  Procedure Laterality Date   HAND SURGERY     thumb surgery     thumb surgery Right 7th grade    Family History  Problem Relation Age of Onset   Hypertension Mother    Depression Father     Diabetes Mellitus I Father    Hypertension Father    Heart attack Father    Bipolar disorder Cousin     Social History   Socioeconomic History   Marital status: Single    Spouse name: Not on file   Number of children: Not on file   Years of education: Not on file   Highest education level: Not on file  Occupational History   Not on file  Tobacco Use   Smoking status: Former    Types: Cigarettes   Smokeless tobacco: Never  Vaping Use   Vaping status: Every Day  Substance and Sexual Activity   Alcohol use: Yes    Alcohol/week: 1.0 standard drink of alcohol    Types: 1 Cans of beer per week   Drug use: Yes    Types: Marijuana   Sexual activity: Never  Other Topics Concern   Not on file  Social History Narrative   Not on file   Social Drivers of Health   Financial Resource Strain: Not on file  Food Insecurity: Not on file  Transportation Needs: Not on file  Physical Activity: Not on file  Stress: Not on file  Social Connections: Not on file  Intimate Partner Violence: Not on file    Review of Systems  Constitutional:  Negative for chills, fever and malaise/fatigue.  HENT:  Positive for congestion and nosebleeds.  Eyes:  Negative for blurred vision and double vision.  Respiratory:  Negative for cough and shortness of breath.   Cardiovascular:  Negative for chest pain and palpitations.  Musculoskeletal:  Negative for joint pain and myalgias.  Neurological:  Negative for dizziness and headaches.  Psychiatric/Behavioral:  Negative for depression. The patient is not nervous/anxious.         Objective    BP 128/77   Pulse 90   Temp 98.5 F (36.9 C)   Ht 5\' 9"  (1.753 m)   Wt 192 lb (87.1 kg)   SpO2 100%   BMI 28.35 kg/m   Physical Exam Constitutional:      Appearance: Normal appearance.  HENT:     Head: Normocephalic.     Mouth/Throat:     Mouth: Mucous membranes are moist.     Pharynx: Oropharynx is clear.  Eyes:     Extraocular Movements:  Extraocular movements intact.     Conjunctiva/sclera: Conjunctivae normal.  Cardiovascular:     Rate and Rhythm: Normal rate and regular rhythm.     Heart sounds: Normal heart sounds. No murmur heard. Pulmonary:     Effort: Pulmonary effort is normal.     Breath sounds: Normal breath sounds.  Skin:    General: Skin is warm and dry.  Neurological:     General: No focal deficit present.     Mental Status: He is alert and oriented to person, place, and time.  Psychiatric:        Mood and Affect: Mood normal.        Behavior: Behavior normal.       Assessment & Plan:  Encounter to establish care  Seasonal allergic rhinitis due to pollen Assessment & Plan: Patient presents today with long history of seasonal allergies. He reports worsening in the last few weeks. Zyrtec refilled today. Advised continued use of Flonase for congestion and drainage. He can use saline spray as needed for dry nose.   Orders: -     Cetirizine HCl; Take 1 tablet (10 mg total) by mouth daily.  Dispense: 30 tablet; Refill: 2  Family history of diabetes mellitus -     Hemoglobin A1c    Return in about 6 months (around 03/18/2024) for phys.   Jearlean Mince Rami Budhu, PA-C

## 2023-09-18 ENCOUNTER — Encounter: Payer: Self-pay | Admitting: Physician Assistant

## 2023-09-18 LAB — HEMOGLOBIN A1C
Est. average glucose Bld gHb Est-mCnc: 111 mg/dL
Hgb A1c MFr Bld: 5.5 % (ref 4.8–5.6)

## 2024-03-17 ENCOUNTER — Encounter: Admitting: Physician Assistant

## 2024-04-01 ENCOUNTER — Encounter: Payer: Self-pay | Admitting: Emergency Medicine

## 2024-04-01 ENCOUNTER — Ambulatory Visit
Admission: EM | Admit: 2024-04-01 | Discharge: 2024-04-01 | Disposition: A | Discharge status: Home / Self Care | Attending: Nurse Practitioner | Admitting: Nurse Practitioner

## 2024-04-01 DIAGNOSIS — Z202 Contact with and (suspected) exposure to infections with a predominantly sexual mode of transmission: Secondary | ICD-10-CM | POA: Insufficient documentation

## 2024-04-01 DIAGNOSIS — R350 Frequency of micturition: Secondary | ICD-10-CM | POA: Diagnosis not present

## 2024-04-01 DIAGNOSIS — Z113 Encounter for screening for infections with a predominantly sexual mode of transmission: Secondary | ICD-10-CM | POA: Diagnosis not present

## 2024-04-01 LAB — POCT URINE DIPSTICK
Blood, UA: NEGATIVE
Glucose, UA: NEGATIVE mg/dL
Leukocytes, UA: NEGATIVE
Nitrite, UA: NEGATIVE
Protein Ur, POC: 30 mg/dL — AB
Spec Grav, UA: 1.02 (ref 1.010–1.025)
Urobilinogen, UA: 2 U/dL — AB
pH, UA: 7 (ref 5.0–8.0)

## 2024-04-01 MED ORDER — CEFTRIAXONE SODIUM 500 MG IJ SOLR
500.0000 mg | INTRAMUSCULAR | Status: DC
  Administered 2024-04-01: 500 mg via INTRAMUSCULAR

## 2024-04-01 NOTE — ED Provider Notes (Signed)
 [facility]    CSN: [id number] Arrival date & time: 04/01/24  1620      History   Chief Complaint No chief complaint on file.   HPI Karl Smith is a 28 y.o. male.   The history is provided by the patient.   Patient presents for STI testing.  Patient states that his girlfriend told him that she had chlamydia.  Patient states that he did believe he had symptoms, but states that he does have urinary frequency.  He denies penile discharge, abdominal pain, pelvic pain, scrotal/testicular pain/swelling, dysuria, decreased urine stream, flank pain, or low back pain.  Patient reports 1 male partner in the past 90 days.  Patient elects to receive treatment today.  He declines testing for HIV and syphilis.  Past Medical History:  Diagnosis Date   Allergy    seasonal   Heart murmur    Herpes simplex type 1 infection    High blood pressure    Pneumonia 43 months old    Patient Active Problem List   Diagnosis Date Noted   Allergic rhinitis 09/17/2023    Past Surgical History:  Procedure Laterality Date   HAND SURGERY     thumb surgery     thumb surgery Right 7th grade       Home Medications    Prior to Admission medications   Medication Sig Start Date End Date Taking? Authorizing Provider  albuterol  (VENTOLIN  HFA) 108 (90 Base) MCG/ACT inhaler Inhale 2 puffs into the lungs every 6 (six) hours as needed for wheezing or shortness of breath. 11/28/21   Leath-Warren, Etta PARAS, NP  cetirizine  (ZYRTEC  ALLERGY) 10 MG tablet Take 1 tablet (10 mg total) by mouth daily. 09/17/23   Grooms, Courtney, PA-C  fluticasone  (FLONASE ) 50 MCG/ACT nasal spray Place 1 spray into both nostrils 2 (two) times daily. 04/04/23   Stuart Vernell Norris, PA-C    Family History Family History  Problem Relation Age of Onset   Hypertension Mother    Depression Father    Diabetes Mellitus I Father    Hypertension Father    Heart attack Father    Bipolar disorder Cousin      Social History Social History   Tobacco Use   Smoking status: Former    Types: Cigarettes   Smokeless tobacco: Never  Vaping Use   Vaping status: Every Day  Substance Use Topics   Alcohol use: Yes    Alcohol/week: 1.0 standard drink of alcohol    Types: 1 Cans of beer per week   Drug use: Yes    Types: Marijuana     Allergies   Patient has no known allergies.   Review of Systems Review of Systems Per HPI  Physical Exam Triage Vital Signs ED Triage Vitals  Encounter Vitals Group     BP 04/01/24 1628 (!) 163/90     Girls Systolic BP Percentile --      Girls Diastolic BP Percentile --      Boys Systolic BP Percentile --      Boys Diastolic BP Percentile --      Pulse Rate 04/01/24 1628 70     Resp 04/01/24 1628 18     Temp 04/01/24 1628 98.7 F (37.1 C)     Temp Source 04/01/24 1628 Oral     SpO2 04/01/24 1628 99 %     Weight --      Height --      Head Circumference --  Peak Flow --      Pain Score 04/01/24 1629 0     Pain Loc --      Pain Education --      Exclude from Growth Chart --    No data found.  Updated Vital Signs BP (!) 163/90 (BP Location: Right Arm)   Pulse 70   Temp 98.7 F (37.1 C) (Oral)   Resp 18   SpO2 99%   Visual Acuity Right Eye Distance:   Left Eye Distance:   Bilateral Distance:    Right Eye Near:   Left Eye Near:    Bilateral Near:     Physical Exam Vitals and nursing note reviewed.  Constitutional:      General: He is not in acute distress.    Appearance: Normal appearance.  Cardiovascular:     Rate and Rhythm: Normal rate and regular rhythm.     Pulses: Normal pulses.     Heart sounds: Normal heart sounds.  Pulmonary:     Effort: Pulmonary effort is normal.     Breath sounds: Normal breath sounds.  Musculoskeletal:     Cervical back: Normal range of motion.  Skin:    General: Skin is warm and dry.  Neurological:     General: No focal deficit present.     Mental Status: He is alert and oriented to  person, place, and time.  Psychiatric:        Mood and Affect: Mood normal.        Behavior: Behavior normal.      UC Treatments / Results  Labs (all labs ordered are listed, but only abnormal results are displayed) Labs Reviewed  POCT URINE DIPSTICK - Abnormal; Notable for the following components:      Result Value   Clarity, UA cloudy (*)    Bilirubin, UA small (*)    Ketones, POC UA trace (5) (*)    Protein Ur, POC =30 (*)    Urobilinogen, UA 2.0 (*)    All other components within normal limits  URINE CULTURE  CYTOLOGY, (ORAL, ANAL, URETHRAL) ANCILLARY ONLY    EKG   Radiology No results found.  Procedures Procedures (including critical care time)  Medications Ordered in UC Medications  cefTRIAXone  (ROCEPHIN ) injection 500 mg (500 mg Intramuscular Given 04/01/24 1646)    Initial Impression / Assessment and Plan / UC Course  I have reviewed the triage vital signs and the nursing notes.  Pertinent labs & imaging results that were available during my care of the patient were reviewed by me and considered in my medical decision making (see chart for details).  Patient reports exposure to gonorrhea.  He has elected to be treated with ceftriaxone  500 mg IM.  Cytology swab is pending.  Supportive care recommendations were provided discussed with the patient to include increasing condom use, notifying all partners if testing results are positive, and to refrain from sexual intercourse for an additional 7 days after treatment is completed.  Patient was in agreement with this plan of care and verbalizes understanding.  All questions were answered.  Patient stable for discharge.  Final Clinical Impressions(s) / UC Diagnoses   Final diagnoses:  Urinary frequency  Exposure to gonorrhea  Screening for STD (sexually transmitted disease)     Discharge Instructions      You were given an injection of ceftriaxone  500 mg.  Refrain from sexual intercourse for an additional 7  days after treatment today. Cytology swab and urine culture are pending.  You will be contacted if the pending test results are abnormal.  You will also have access to the results via MyChart. Increase condom use with each sexual encounter. If your test results are positive, you will need to notify all partners. Follow-up as needed.     ED Prescriptions   None    PDMP not reviewed this encounter.   Gilmer Etta PARAS, NP 04/01/24 1659

## 2024-04-01 NOTE — ED Triage Notes (Signed)
 States girlfriend told him she tested positive for gonorrhea.  Denies any symptoms.

## 2024-04-01 NOTE — Discharge Instructions (Addendum)
 You were given an injection of ceftriaxone  500 mg.  Refrain from sexual intercourse for an additional 7 days after treatment today. Cytology swab and urine culture are pending.  You will be contacted if the pending test results are abnormal.  You will also have access to the results via MyChart. Increase condom use with each sexual encounter. If your test results are positive, you will need to notify all partners. Follow-up as needed.

## 2024-04-02 ENCOUNTER — Encounter: Payer: Self-pay | Admitting: Physician Assistant

## 2024-04-02 ENCOUNTER — Telehealth: Payer: Self-pay | Admitting: Emergency Medicine

## 2024-04-02 LAB — CYTOLOGY, (ORAL, ANAL, URETHRAL) ANCILLARY ONLY
Chlamydia: NEGATIVE
Comment: NEGATIVE
Comment: NEGATIVE
Comment: NORMAL
Neisseria Gonorrhea: NEGATIVE
Trichomonas: POSITIVE — AB

## 2024-04-02 LAB — URINE CULTURE: Culture: NO GROWTH

## 2024-04-02 MED ORDER — METRONIDAZOLE 500 MG PO TABS: 2000.0000 mg | ORAL_TABLET | Freq: Once | ORAL | 0 refills | Status: AC

## 2024-04-02 NOTE — Telephone Encounter (Signed)
 Patient called and wanted to review his cytology swab results.  Patient informed he tested positive for trichomonas.  Flagyl 2 grams sent to [facility] for patient.  Patient verbalized understanding.

## 2024-04-03 ENCOUNTER — Telehealth: Payer: Self-pay

## 2024-04-03 MED ORDER — METRONIDAZOLE 500 MG PO TABS: 2000.0000 mg | ORAL_TABLET | ORAL | 0 refills | Status: AC

## 2024-04-03 NOTE — Telephone Encounter (Signed)
 Pt called stating that he want to [facility] on [address] And there was a sign saying they are temporarily closed and would need the Rx sent to [facility] on [address]. per pt request pharmacy has been changed.

## 2024-04-05 ENCOUNTER — Ambulatory Visit (HOSPITAL_COMMUNITY): Payer: Self-pay

## 2024-04-05 ENCOUNTER — Encounter: Payer: Self-pay | Admitting: Physician Assistant

## 2024-04-05 ENCOUNTER — Ambulatory Visit: Admitting: Physician Assistant

## 2024-04-05 VITALS — BP 165/89 | HR 85 | Temp 98.7°F | Ht 69.0 in | Wt 177.0 lb

## 2024-04-05 DIAGNOSIS — R8290 Unspecified abnormal findings in urine: Secondary | ICD-10-CM

## 2024-04-05 NOTE — Progress Notes (Signed)
   Acute Office Visit  Subjective:     Patient ID: Karl Smith, male    DOB: 1995/11/25, 27 y.o.   MRN: [id number]   Discussed the use of AI scribe software for clinical note transcription with the patient, who gave verbal consent to proceed.  History of Present Illness Karl Smith is a 28 year old male who presents with concerns about abnormal urine test results and persistent stomach discomfort.  He underwent a urine test at urgent care for an STD evaluation, which showed multiple abnormalities. He experiences persistent nausea and stomach discomfort, with a sensation of bloating and unexplained stomach rumbling. There is no dysuria or significant changes in urination patterns, although his urine stream is occasionally misdirected.  He recalls a past episode of dysuria while incarcerated, which resolved with increased water  intake and reduced soda consumption. He was recently treated for trichomoniasis with a single dose of metronidazole.    Review of Systems  Constitutional:  Negative for chills, fever and malaise/fatigue.  Gastrointestinal:  Positive for abdominal pain (not currently). Negative for constipation, diarrhea, nausea and vomiting.  Genitourinary:  Negative for dysuria, flank pain, frequency, hematuria and urgency.        Objective:     BP (!) 165/89   Pulse 85   Temp 98.7 F (37.1 C)   Ht 5' 9 (1.753 m)   Wt 177 lb (80.3 kg)   SpO2 95%   BMI 26.14 kg/m   Physical Exam Constitutional:      General: He is not in acute distress.    Appearance: Normal appearance. He is normal weight. He is not ill-appearing.  HENT:     Head: Normocephalic and atraumatic.     Mouth/Throat:     Mouth: Mucous membranes are moist.     Pharynx: Oropharynx is clear.  Eyes:     Extraocular Movements: Extraocular movements intact.     Conjunctiva/sclera: Conjunctivae normal.  Cardiovascular:     Rate and Rhythm: Normal rate and regular rhythm.     Heart sounds:  Normal heart sounds. No murmur heard. Pulmonary:     Effort: Pulmonary effort is normal.  Abdominal:     General: Abdomen is flat. Bowel sounds are normal.     Palpations: Abdomen is soft.     Tenderness: There is no abdominal tenderness.  Skin:    General: Skin is warm and dry.  Neurological:     General: No focal deficit present.     Mental Status: He is alert and oriented to person, place, and time.  Psychiatric:        Mood and Affect: Mood normal.        Behavior: Behavior normal.     No results found for any visits on 04/05/24.      Assessment & Plan:  Abnormal urine findings Assessment & Plan: Patient presents today for follow up due to abnormal urine findings at urgent care last week. Urinalysis today was completely within normal limits. Patient recently diagnosed and treated for trichomoniasis. He denies any associated symptoms such as dysuria, frequency, urgency, N/V, flank pain, or hematuria. Advised patient to finish course of metronidazole. Advise recent partners to get retested for trichomoniasis and treated if necessary before resuming sexual activity. Follow up as needed.   Orders: -     Urinalysis    Return if symptoms worsen or fail to improve.  Charmaine Seleste Tallman, PA-C

## 2024-04-05 NOTE — Assessment & Plan Note (Signed)
 Patient presents today for follow up due to abnormal urine findings at urgent care last week. Urinalysis today was completely within normal limits. Patient recently diagnosed and treated for trichomoniasis. He denies any associated symptoms such as dysuria, frequency, urgency, N/V, flank pain, or hematuria. Advised patient to finish course of metronidazole. Advise recent partners to get retested for trichomoniasis and treated if necessary before resuming sexual activity. Follow up as needed.

## 2024-04-06 LAB — URINALYSIS
Bilirubin, UA: NEGATIVE
Glucose, UA: NEGATIVE
Ketones, UA: NEGATIVE
Leukocytes,UA: NEGATIVE
Nitrite, UA: NEGATIVE
Protein,UA: NEGATIVE
RBC, UA: NEGATIVE
Specific Gravity, UA: 1.025 (ref 1.005–1.030)
Urobilinogen, Ur: 0.2 mg/dL (ref 0.2–1.0)
pH, UA: 7 (ref 5.0–7.5)

## 2024-05-10 ENCOUNTER — Ambulatory Visit: Admitting: Physician Assistant
